# Patient Record
Sex: Female | Born: 1965
Health system: Southern US, Community
[De-identification: ages and names within clinical notes are randomized; demographics above are authoritative.]

## PROBLEM LIST (undated history)

## (undated) DIAGNOSIS — H409 Unspecified glaucoma: Secondary | ICD-10-CM

## (undated) DIAGNOSIS — T7840XA Allergy, unspecified, initial encounter: Secondary | ICD-10-CM

## (undated) DIAGNOSIS — I7121 Aneurysm of the ascending aorta, without rupture: Secondary | ICD-10-CM

## (undated) DIAGNOSIS — I712 Thoracic aortic aneurysm, without rupture: Secondary | ICD-10-CM

## (undated) DIAGNOSIS — H579 Unspecified disorder of eye and adnexa: Secondary | ICD-10-CM

## (undated) HISTORY — PX: SPINE SURGERY: SHX786

## (undated) HISTORY — DX: Unspecified glaucoma: H40.9

## (undated) HISTORY — DX: Unspecified disorder of eye and adnexa: H57.9

## (undated) HISTORY — PX: BACK SURGERY: SHX140

## (undated) HISTORY — DX: Allergy, unspecified, initial encounter: T78.40XA

## (undated) HISTORY — DX: Aneurysm of the ascending aorta, without rupture: I71.21

## (undated) HISTORY — DX: Thoracic aortic aneurysm, without rupture: I71.2

---
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
## 1997-09-14 ENCOUNTER — Other Ambulatory Visit: Admission: RE | Admit: 1997-09-14 | Discharge: 1997-09-14 | Payer: Self-pay | Admitting: *Deleted

## 1999-08-29 ENCOUNTER — Other Ambulatory Visit: Admission: RE | Admit: 1999-08-29 | Discharge: 1999-08-29 | Payer: Self-pay | Admitting: Obstetrics and Gynecology

## 2000-10-15 ENCOUNTER — Other Ambulatory Visit: Admission: RE | Admit: 2000-10-15 | Discharge: 2000-10-15 | Payer: Self-pay | Admitting: Obstetrics and Gynecology

## 2001-11-10 ENCOUNTER — Other Ambulatory Visit: Admission: RE | Admit: 2001-11-10 | Discharge: 2001-11-10 | Payer: Self-pay | Admitting: Obstetrics and Gynecology

## 2003-01-06 ENCOUNTER — Other Ambulatory Visit: Admission: RE | Admit: 2003-01-06 | Discharge: 2003-01-06 | Payer: Self-pay | Admitting: Obstetrics and Gynecology

## 2003-05-15 ENCOUNTER — Ambulatory Visit (HOSPITAL_COMMUNITY): Admission: RE | Admit: 2003-05-15 | Discharge: 2003-05-15 | Payer: Self-pay | Admitting: Hematology & Oncology

## 2004-01-17 ENCOUNTER — Encounter: Admission: RE | Admit: 2004-01-17 | Discharge: 2004-01-17 | Payer: Self-pay | Admitting: Hematology & Oncology

## 2004-02-02 ENCOUNTER — Encounter: Admission: RE | Admit: 2004-02-02 | Discharge: 2004-02-02 | Payer: Self-pay | Admitting: Hematology & Oncology

## 2006-05-05 ENCOUNTER — Ambulatory Visit: Payer: Self-pay | Admitting: Vascular Surgery

## 2006-05-05 ENCOUNTER — Ambulatory Visit: Admission: RE | Admit: 2006-05-05 | Discharge: 2006-05-05 | Payer: Self-pay | Admitting: Hematology & Oncology

## 2006-05-12 ENCOUNTER — Ambulatory Visit (HOSPITAL_COMMUNITY): Admission: RE | Admit: 2006-05-12 | Discharge: 2006-05-12 | Payer: Self-pay | Admitting: Hematology & Oncology

## 2007-07-02 ENCOUNTER — Ambulatory Visit (HOSPITAL_COMMUNITY): Admission: RE | Admit: 2007-07-02 | Discharge: 2007-07-02 | Payer: Self-pay | Admitting: Hematology & Oncology

## 2007-08-31 ENCOUNTER — Inpatient Hospital Stay (HOSPITAL_COMMUNITY): Admission: RE | Admit: 2007-08-31 | Discharge: 2007-09-02 | Payer: Self-pay | Admitting: Neurosurgery

## 2008-08-24 HISTORY — PX: BACK SURGERY: SHX140

## 2010-07-09 NOTE — H&P (Signed)
Hannah Galloway, ZECH                 ACCOUNT NO.:  [id number]   MEDICAL RECORD NO.:  [id number]          PATIENT TYPE:  OIB   LOCATION:  3020                         FACILITY:  [facility]   PHYSICIAN:  Payton Doughty, M.D.      DATE OF BIRTH:  May 14, 1965   DATE OF ADMISSION:  08/31/2007  DATE OF DISCHARGE:                              HISTORY & PHYSICAL   ADMISSION DIAGNOSIS:  Foraminal disk on the right side at L4-L5.   SURGEON:  Payton Doughty, M.D., Neurosurgery.   HISTORY:  This is a very nice 45 year old right-handed white girl  beginning in April pain in her back and down her right hip and down the  right leg.  MR demonstrates foraminal disk on the right side at L4-L5,  and she was tried with epidural steroids to no avail.  She is here now  for foraminal diskectomy on the right at L4-L5.   MEDICAL HISTORY:  She is benign.  She is on oral contraceptives and  Aleve.  She has no allergies, had no operations.   FAMILY HISTORY:  Mother at 4 and father at 25 both were in good health.  Father has diabetes and had a stroke.   REVIEW OF SYSTEMS:  Remarkable for back and leg pain.   PHYSICAL EXAMINATION:  HEENT:  Normal limits.  NECK:  She has good range of motion.  CHEST:  Clear.  CARDIAC:  Regular rate and rhythm.  ABDOMEN:  Nontender.  No hepatosplenomegaly.  EXTREMITIES:  No clubbing or cyanosis.  GU:  Peripheral pulses are good.  NEUROLOGICALLY:  She is awake, alert, and oriented.  Cranial nerves are  intact.  Motor exam shows 5/5 strength throughout the upper and lower  distribution.  Sensory dysesthesia described in the right L4-L5  distribution.  Reflexes are 2 at the knees, 2 at the ankles.  Straight  leg raise is positive on the right, reverse straight leg is not  positive.  No signs of hip irritability.  MR, which read by the  radiologist, mild degenerative changes at L3-L4 and L4-L5.  Closer  inspection reveals a foraminal disk on the right at L4-L5 and  involvement of  the 4  root as it exits.   CLINICAL IMPRESSIONS:  Foraminal disk on the right side at L4-L5.  She  failed conservative therapy.   PLAN:  The plan is for right foraminal diskectomy.  The risks and  benefits have been discussed with her and she wishes to proceed.           ______________________________  Payton Doughty, M.D.    MWR/MEDQ  D:  08/31/2007  T:  09/01/2007  Job:  [id number]

## 2010-07-09 NOTE — Op Note (Signed)
NAMEJEARLDINE, Hannah Galloway                 ACCOUNT NO.:  [id number]   MEDICAL RECORD NO.:  [id number]          PATIENT TYPE:  OIB   LOCATION:  3020                         FACILITY:  [facility]   PHYSICIAN:  Payton Doughty, M.D.      DATE OF BIRTH:  08-02-1965   DATE OF PROCEDURE:  08/31/2007  DATE OF DISCHARGE:                               OPERATIVE REPORT   PREOPERATIVE DIAGNOSIS:  Foraminal disk on the right side at L4-L5.   POSTOPERATIVE DIAGNOSIS:  Foraminal disk on the right side at L4-L5.   PROCEDURE:  Right L4-L5 foraminal diskectomy.   SURGEON:  Payton Doughty, MD   ANESTHESIA:  General endotracheal.   PREPARATION:  Prepped and draped with alcohol wipe.   COMPLICATIONS:  None.   NURSE ASSISTANT:  Covington.   DOCTOR ASSISTANT:  Nudelman.   BODY OF TEXT:  A 45 year old girl with a foraminal disk on the right  side at L4-L5 taken to the operating room, smoothly, anesthetized,  intubated, placed prone on the operating table.  Following shave,  prepped and draped in usual sterile fashion.  Skin was infiltrated with  1% lidocaine with 100,000 epinephrine.  Skin was incised from over the  lamina of L4-L5 on the right side and the lamina 4 was dissected in  subperiosteal plane.  Marker was placed.  Intraoperative x-ray taken to  confirm correctness level operating laterally to medially up the lateral  portion of the pars interarticularis and the superior portion of the  inferior articular process of L4 and the superior portion of the  superior articular process of L5 were removed.  This allowed access to  the foraminal area and the dorsal root ganglion was identified,  retracted superiorly demonstrating the disk just below the diskectomy  was carried out, all graspable fragments were removed.  Following  complete decompression, the nerve root exited freely.  Bony defect was  filled with Depo-Medrol soaked fat.  Successive layers of 0 Vicryl, 2-0  Vicryl, and 4-0 Vicryl were used to close.   Benzoin and Steri-Strips  were placed, made occlusive Telfa and OpSite.  The patient returned to  recovery room in good condition.           ______________________________  Payton Doughty, M.D.     MWR/MEDQ  D:  08/31/2007  T:  09/01/2007  Job:  [id number]

## 2010-11-21 LAB — COMPREHENSIVE METABOLIC PANEL
ALT: 32
AST: 22
Albumin: 4.2
Alkaline Phosphatase: 36 — ABNORMAL LOW
BUN: 9
CO2: 25
Calcium: 9.2
Chloride: 102
Creatinine, Ser: 0.7
GFR calc Af Amer: >60
GFR calc non Af Amer: >60
Glucose, Bld: 93
Potassium: 4
Sodium: 135
Total Bilirubin: 0.7
Total Protein: 6.9

## 2010-11-21 LAB — PROTIME-INR
INR: 1
Prothrombin Time: 13.2

## 2010-11-21 LAB — URINALYSIS, ROUTINE W REFLEX MICROSCOPIC
Bilirubin Urine: NEGATIVE
Glucose, UA: NEGATIVE
Hgb urine dipstick: NEGATIVE
Ketones, ur: NEGATIVE
Nitrite: NEGATIVE
Protein, ur: NEGATIVE
Specific Gravity, Urine: 1.019
Urobilinogen, UA: 1
pH: 7

## 2010-11-21 LAB — URINE MICROSCOPIC-ADD ON

## 2010-11-21 LAB — DIFFERENTIAL
Basophils Absolute: 0
Basophils Relative: 0
Eosinophils Absolute: 0.1
Eosinophils Relative: 1
Lymphocytes Relative: 25
Lymphs Abs: 2.6
Monocytes Absolute: 0.6
Monocytes Relative: 6
Neutro Abs: 7
Neutrophils Relative %: 68

## 2010-11-21 LAB — CBC
HCT: 38.7
Hemoglobin: 13.3
MCHC: 34.4
MCV: 95.9
Platelets: 296
RBC: 4.04
RDW: 14
WBC: 10.3

## 2010-11-21 LAB — APTT: aPTT: 24

## 2012-01-15 LAB — HM MAMMOGRAPHY: HM Mammogram: NEGATIVE

## 2012-04-16 ENCOUNTER — Encounter: Payer: Self-pay | Admitting: Family Medicine

## 2012-04-16 ENCOUNTER — Ambulatory Visit (INDEPENDENT_AMBULATORY_CARE_PROVIDER_SITE_OTHER): Payer: 59 | Admitting: Family Medicine

## 2012-04-16 VITALS — BP 100/68 | HR 72 | Temp 99.0°F | Resp 12 | Ht 66.5 in | Wt 162.0 lb

## 2012-04-16 DIAGNOSIS — H6992 Unspecified Eustachian tube disorder, left ear: Secondary | ICD-10-CM

## 2012-04-16 DIAGNOSIS — H6990 Unspecified Eustachian tube disorder, unspecified ear: Secondary | ICD-10-CM

## 2012-04-16 NOTE — Progress Notes (Signed)
  Subjective:    Patient ID: Hannah Galloway, female    DOB: [birth year], 47 y.o.   MRN: [id number]  HPI New to establish care. Patient sees gynecologist regularly. She apparently had some early glaucoma changes and followed closely by ophthalmology but not started on medication yet. Generally very healthy. She's had previous tonsillectomy. Takes no regular medications. No known drug allergies.  Patient is married and has 2 sons. She works as a Academic librarian at [employer]. No history of smoking. No alcohol use. Last tetanus unknown.  Family history significant for father with history of type 2 diabetes and history of stroke. Father died within the past year of stroke complications. Brother with T-cell lymphoma.  She's had some recent left ear fullness. No vertigo. No hearing changes. No drainage. No nasal congestion. Intermittent diffuse frontal sinus headaches. No aggravating factors. No fever or chills. No sore throat   Review of Systems  Constitutional: Negative for fever and chills.  HENT: Negative for hearing loss, ear pain, congestion, sore throat and ear discharge.   Respiratory: Negative for cough and shortness of breath.   Neurological: Negative for headaches.       Objective:   Physical Exam  Constitutional: She appears well-developed and well-nourished.  HENT:  Right Ear: External ear normal.  Left Ear: External ear normal.  Mouth/Throat: Oropharynx is clear and moist.  Neck: Neck supple. No thyromegaly present.  Cardiovascular: Normal rate and regular rhythm.   Pulmonary/Chest: Effort normal and breath sounds normal. No respiratory distress. She has no wheezes. She has no rales.  Musculoskeletal: She exhibits no edema.  Lymphadenopathy:    She has no cervical adenopathy.          Assessment & Plan:  Probable left eustachian tube dysfunction. Normal exam. Reassurance.  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 Follow closely by ophthalmology.  Health  maintenance. Patient followed by gynecology. We've encouraged her to confirm last tetanus.

## 2012-12-30 ENCOUNTER — Other Ambulatory Visit: Payer: Self-pay

## 2013-03-22 ENCOUNTER — Other Ambulatory Visit (HOSPITAL_COMMUNITY): Payer: Self-pay | Admitting: Neurosurgery

## 2013-03-22 DIAGNOSIS — M5410 Radiculopathy, site unspecified: Secondary | ICD-10-CM

## 2013-03-30 ENCOUNTER — Ambulatory Visit (HOSPITAL_COMMUNITY)
Admission: RE | Admit: 2013-03-30 | Discharge: 2013-03-30 | Disposition: A | Discharge status: Home / Self Care | Payer: 59 | Source: Ambulatory Visit | Attending: Neurosurgery | Admitting: Neurosurgery

## 2013-03-30 DIAGNOSIS — M5410 Radiculopathy, site unspecified: Secondary | ICD-10-CM

## 2013-03-30 DIAGNOSIS — M51379 Other intervertebral disc degeneration, lumbosacral region without mention of lumbar back pain or lower extremity pain: Secondary | ICD-10-CM | POA: Insufficient documentation

## 2013-03-30 DIAGNOSIS — M79609 Pain in unspecified limb: Secondary | ICD-10-CM | POA: Insufficient documentation

## 2013-03-30 DIAGNOSIS — M5144 Schmorl's nodes, thoracic region: Secondary | ICD-10-CM | POA: Insufficient documentation

## 2013-03-30 DIAGNOSIS — M5146 Schmorl's nodes, lumbar region: Secondary | ICD-10-CM | POA: Insufficient documentation

## 2013-03-30 DIAGNOSIS — M5380 Other specified dorsopathies, site unspecified: Secondary | ICD-10-CM | POA: Insufficient documentation

## 2013-03-30 DIAGNOSIS — M5137 Other intervertebral disc degeneration, lumbosacral region: Secondary | ICD-10-CM | POA: Insufficient documentation

## 2013-03-30 DIAGNOSIS — R209 Unspecified disturbances of skin sensation: Secondary | ICD-10-CM | POA: Insufficient documentation

## 2013-03-30 DIAGNOSIS — M25559 Pain in unspecified hip: Secondary | ICD-10-CM | POA: Insufficient documentation

## 2013-03-30 MED ORDER — GADOBENATE DIMEGLUMINE 529 MG/ML IV SOLN
14.0000 mL | Freq: Once | INTRAVENOUS | Status: AC | PRN
Start: 2013-03-30 — End: 2013-03-30
  Administered 2013-03-30: 14 mL via INTRAVENOUS

## 2013-04-01 ENCOUNTER — Ambulatory Visit (HOSPITAL_COMMUNITY): Payer: 59

## 2013-08-29 ENCOUNTER — Telehealth: Payer: Self-pay | Admitting: Family Medicine

## 2013-08-29 NOTE — Telephone Encounter (Signed)
Pt would like to know if you would take her son who is 15 as a new pt?? He is home from college and needs a physical.

## 2013-08-29 NOTE — Telephone Encounter (Signed)
Yes

## 2013-08-31 ENCOUNTER — Telehealth: Payer: Self-pay | Admitting: Family Medicine

## 2013-08-31 ENCOUNTER — Encounter: Payer: Self-pay | Admitting: Family

## 2013-08-31 ENCOUNTER — Ambulatory Visit (INDEPENDENT_AMBULATORY_CARE_PROVIDER_SITE_OTHER): Payer: 59 | Admitting: Family

## 2013-08-31 VITALS — BP 120/80 | HR 73 | Temp 98.5°F | Wt 163.0 lb

## 2013-08-31 DIAGNOSIS — L259 Unspecified contact dermatitis, unspecified cause: Secondary | ICD-10-CM

## 2013-08-31 DIAGNOSIS — L299 Pruritus, unspecified: Secondary | ICD-10-CM

## 2013-08-31 MED ORDER — METHYLPREDNISOLONE 4 MG PO KIT: PACK | ORAL | Status: AC

## 2013-08-31 MED ORDER — METHYLPREDNISOLONE 4 MG PO KIT: PACK | ORAL | Status: DC

## 2013-08-31 NOTE — Telephone Encounter (Signed)
Pt aware Rx was resent

## 2013-08-31 NOTE — Telephone Encounter (Signed)
Pt call to say she is at the [facility] and rx is not there methylPREDNISolone (MEDROL DOSEPAK) 4 MG tablet

## 2013-08-31 NOTE — Patient Instructions (Signed)

## 2013-08-31 NOTE — Progress Notes (Signed)
Pre visit review using our clinic review tool, if applicable. No additional management support is needed unless otherwise documented below in the visit note. 

## 2013-08-31 NOTE — Progress Notes (Signed)
   Subjective:    Patient ID: Hannah Galloway, female    DOB: [birth year], 48 y.o.   MRN: [id number]  HPI  48 year old white female, nonsmoker is in today because a rash to her left arm, neck, anterior chest wall she describes as itchy. Denies any changes in detergents, soaps, lotions. No new foods. Does report being outside picking weeds over the weekend. No known contact with poison ivy or oak. Has been applying calamine lotion with minimal relief.  Review of Systems  Constitutional: Negative.   HENT: Negative.   Respiratory: Negative.  Negative for shortness of breath and wheezing.   Cardiovascular: Negative.   Gastrointestinal: Negative.   Endocrine: Negative.   Musculoskeletal: Negative.   Skin: Positive for rash.       History, red rash to the right arm, neck, and chest  Neurological: Negative.   Psychiatric/Behavioral: Negative.    Past Medical History  Diagnosis Date  . Glaucoma     History   Social History  . Marital Status: Married    Spouse Name: N/A    Number of Children: N/A  . Years of Education: N/A   Occupational History  . Not on file.   Social History Main Topics  . Smoking status: Never Smoker   . Smokeless tobacco: Not on file  . Alcohol Use: Not on file  . Drug Use: Not on file  . Sexual Activity: Not on file   Other Topics Concern  . Not on file   Social History Narrative  . No narrative on file    Past Surgical History  Procedure Laterality Date  . Tonsillectomy and adenoidectomy  1974    Family History  Problem Relation Age of Onset  . Stroke Father   . Heart disease Father   . Diabetes Father     type ll  . Lymphoma Brother 56    t-call  . Cancer Brother 66    T cell lymphoma    No Known Allergies  Current Outpatient Prescriptions on File Prior to Visit  Medication Sig Dispense Refill  . norethindrone-ethinyl estradiol-iron (MICROGESTIN FE,GILDESS FE,LOESTRIN FE) 1.5-30 MG-MCG tablet Take 1 tablet by mouth daily. Per Dr  Garwin Brothers, OB/GYN       No current facility-administered medications on file prior to visit.    BP 120/80  Pulse 73  Temp(Src) 98.5 F (36.9 C) (Oral)  Wt 163 lb (73.936 kg)chart     Objective:   Physical Exam  Constitutional: She is oriented to person, place, and time. She appears well-developed and well-nourished.  Neck: Normal range of motion. Neck supple.  Cardiovascular: Normal rate, regular rhythm and normal heart sounds.   Pulmonary/Chest: Effort normal and breath sounds normal.  Musculoskeletal: Normal range of motion.  Neurological: She is alert and oriented to person, place, and time.  Skin: Rash noted. There is erythema.  Psychiatric: She has a normal mood and affect.          Assessment & Plan:  January was seen today for insect bite.  Diagnoses and associated orders for this visit:  Contact dermatitis  Other Orders - Discontinue: methylPREDNISolone (MEDROL DOSEPAK) 4 MG tablet; follow package directions - methylPREDNISolone (MEDROL DOSEPAK) 4 MG tablet; follow package directions   call the office with any questions or concerns here recheck as scheduled and as needed.

## 2013-09-05 ENCOUNTER — Ambulatory Visit (INDEPENDENT_AMBULATORY_CARE_PROVIDER_SITE_OTHER): Payer: 59 | Admitting: Family Medicine

## 2013-09-05 ENCOUNTER — Encounter: Payer: Self-pay | Admitting: Family Medicine

## 2013-09-05 ENCOUNTER — Telehealth: Payer: Self-pay | Admitting: Family Medicine

## 2013-09-05 VITALS — HR 64 | Temp 98.4°F | Wt 163.0 lb

## 2013-09-05 DIAGNOSIS — L259 Unspecified contact dermatitis, unspecified cause: Secondary | ICD-10-CM

## 2013-09-05 DIAGNOSIS — R21 Rash and other nonspecific skin eruption: Secondary | ICD-10-CM

## 2013-09-05 MED ORDER — METHYLPREDNISOLONE ACETATE 80 MG/ML IJ SUSP
80.0000 mg | Freq: Once | INTRAMUSCULAR | Status: AC
  Administered 2013-09-05: 80 mg via INTRAMUSCULAR

## 2013-09-05 MED ORDER — PREDNISONE 10 MG PO TABS: ORAL_TABLET | ORAL | Status: DC

## 2013-09-05 NOTE — Progress Notes (Signed)
Pre visit review using our clinic review tool, if applicable. No additional management support is needed unless otherwise documented below in the visit note. 

## 2013-09-05 NOTE — Patient Instructions (Signed)

## 2013-09-05 NOTE — Telephone Encounter (Signed)
Patient Information:  Caller Name: [place]  Phone: [phone]  Patient: Hannah Galloway  Gender: Female  DOB: 1965/05/21  Age: 48 Years  PCP: Carolann Littler [facility])  Pregnant: No  Office Follow Up:  Does the office need to follow up with this patient?: No  Instructions For The Office: N/A  RN Note:  This pt. was scheduled to see Dr. Elease Hashimoto today at 15:30.  Symptoms  Reason For Call & Symptoms: Martin Majestic in to see Roxy Cedar for a spider bite on 08/31/13 and was placed on Prednisone. Had a rash. Today(09/05/13) is the last day of the medication. Spider bite was on the right forearm and now the bite is red and raised. No fever. States she cannot tell that the Prednisone helped her at all. Continues to have an itchy rash and the bite has an area of spreading redness the size of a half-dollar.  Reviewed Health History In EMR: Yes  Reviewed Medications In EMR: Yes  Reviewed Allergies In EMR: Yes  Reviewed Surgeries / Procedures: Yes  Date of Onset of Symptoms: 08/28/2013  Treatments Tried: Prednisone  Treatments Tried Worked: No OB / GYN:  LMP: 08/30/2013  Guideline(s) Used:  Insect Bite  Disposition Per Guideline:   See Today in Office  Reason For Disposition Reached:   Red or very tender (to touch) area, getting larger over 48 hours after the bite  Advice Given:  Call Back If:  You become worse.  Patient Will Follow Care Advice:  YES  Appointment Scheduled:  09/05/2013 15:30:00 Appointment Scheduled Provider:  Carolann Littler [facility])

## 2013-09-05 NOTE — Progress Notes (Signed)
   Subjective:    Patient ID: Hannah Galloway, female    DOB: 1965-06-10, 48 y.o.   MRN: [id number]  HPI Patient presents with severe pruritic rash involving right forearm but also several areas on her neck, upper back, and left upper extremity. She was out pulling weeds in her yard a little over week ago. She felt she may have had a bug bite. Her initial patch was right forearm. She went Monday for evaluation here and was prescribed Medrol but this has not helped much. She has itching and warmth. Rash is possibly worse. No fevers or chills.   Review of Systems  Constitutional: Negative for fever and chills.  Skin: Positive for rash.       Objective:   Physical Exam  Constitutional: She appears well-developed and well-nourished.  Cardiovascular: Normal rate and regular rhythm.   Skin: Rash noted.  Patient has slightly raised vesicular rash with erythematous base with large patch right forearm but smaller patches on her right wrist left arm upper chest and anterior and posterior neck          Assessment & Plan:  Contact dermatitis with widespread distribution. Try to avoid getting overheated. Depo-Medrol 80 mg IM given. Oral prednisone taper if not adequately relieved with Depo-Medrol

## 2013-09-05 NOTE — Telephone Encounter (Signed)
Just FYI.

## 2013-09-21 ENCOUNTER — Telehealth: Payer: Self-pay | Admitting: Family Medicine

## 2013-09-21 MED ORDER — PREDNISONE 10 MG PO TABS: ORAL_TABLET | ORAL | Status: DC

## 2013-09-21 NOTE — Telephone Encounter (Signed)
Refill OK

## 2013-09-21 NOTE — Telephone Encounter (Signed)
Dr. Elease Hashimoto has been treating the pt for rash, pt states every time she finishes the the rx predniSONE (DELTASONE) 10 MG tablet the rash has started to come back. States rash has gotten better however not completely gone,pt wants to know if she can get 1 more refill on the prednisone.

## 2013-09-21 NOTE — Telephone Encounter (Signed)
Rx sent to pharmacy   

## 2013-11-12 ENCOUNTER — Emergency Department (HOSPITAL_COMMUNITY)
Admission: EM | Admit: 2013-11-12 | Discharge: 2013-11-12 | Disposition: A | Discharge status: Home / Self Care | Payer: PRIVATE HEALTH INSURANCE | Source: Home / Self Care | Attending: Family Medicine | Admitting: Family Medicine

## 2013-11-12 ENCOUNTER — Encounter (HOSPITAL_COMMUNITY): Payer: Self-pay | Admitting: Emergency Medicine

## 2013-11-12 DIAGNOSIS — IMO0002 Reserved for concepts with insufficient information to code with codable children: Secondary | ICD-10-CM | POA: Diagnosis not present

## 2013-11-12 DIAGNOSIS — S0100XA Unspecified open wound of scalp, initial encounter: Secondary | ICD-10-CM

## 2013-11-12 DIAGNOSIS — S060X0A Concussion without loss of consciousness, initial encounter: Secondary | ICD-10-CM | POA: Diagnosis not present

## 2013-11-12 DIAGNOSIS — S0101XA Laceration without foreign body of scalp, initial encounter: Secondary | ICD-10-CM

## 2013-11-12 MED ORDER — LIDOCAINE-EPINEPHRINE (PF) 2 %-1:200000 IJ SOLN
INTRAMUSCULAR | Status: AC
  Filled 2013-11-12: qty 20

## 2013-11-12 NOTE — ED Notes (Signed)
scalp laceration earlier today from edge of car trunk lid. aprox 2 cm , minimal blood loss

## 2013-11-12 NOTE — ED Provider Notes (Signed)
Hannah Galloway is a 48 y.o. female who presents to Urgent Care today for scalp laceration. Patient was taking her car today when she accidentally hit the top of her head on the open trunk lid. She suffered a laceration to the frontal scalp. Additionally she notes headache dizziness and fogginess. She denies any loss of consciousness or loss of function. No fevers or chills nausea vomiting or diarrhea. She notes that her last tetanus shot was within the last 5 years.   Past Medical History  Diagnosis Date  . Glaucoma    History  Substance Use Topics  . Smoking status: Never Smoker   . Smokeless tobacco: Not on file  . Alcohol Use: No   ROS as above Medications: No current facility-administered medications for this encounter.   Current Outpatient Prescriptions  Medication Sig Dispense Refill  . norethindrone-ethinyl estradiol-iron (MICROGESTIN FE,GILDESS FE,LOESTRIN FE) 1.5-30 MG-MCG tablet Take 1 tablet by mouth daily. Per Dr Garwin Brothers, OB/GYN        Exam:  BP 131/83  Pulse 73  Temp(Src) 98.8 F (37.1 C) (Oral)  SpO2 98% Gen: Well NAD HEENT: EOMI,  MMM PERRLA Lungs: Normal work of breathing. CTABL Heart: RRR no MRG Abd: NABS, Soft. Nondistended, Nontender Exts: Brisk capillary refill, warm and well perfused.  Scalp: 2 cm laceration frontal scalp. Extends through the dermis but not the deep structures. Neuro: Alert and oriented cranial nerves intact normal double. leg balance however impaired tandem and single-leg balance. Normal coordination bilaterally Normal gait 3 recall 3/3 immediate and 2/3 delayed Normal serial sevens  Scalp laceration repair: Area anesthetized with 2 mL of lidocaine with epinephrine. Scalp irrigated with sure cleanse solution. 3 staples used to close the wound.  No results found for this or any previous visit (from the past 24 hour(s)). No results found.  Assessment and Plan: 48 y.o. female with  1) scalp laceration: Tetanus up-to-date. Repaired  with staples. Return in one week for staple removal. 2) concussion: Patient is placed symptoms of concussion. Plan for cognitive and physical rest. Doubt serious intracranial injury given relatively normal neurologic exam  Discussed warning signs or symptoms. Please see discharge instructions. Patient expresses understanding.     Gregor Hams, MD 11/12/13 [phone]

## 2013-11-12 NOTE — Discharge Instructions (Signed)
Thank you for coming in today. Take it easy for the next week or so.  No heavy mental or physical exertion.  Return in 1 week for staple removal.    Concussion A concussion, or closed-head injury, is a brain injury caused by a direct blow to the head or by a quick and sudden movement (jolt) of the head or neck. Concussions are usually not life-threatening. Even so, the effects of a concussion can be serious. If you have had a concussion before, you are more likely to experience concussion-like symptoms after a direct blow to the head.  CAUSES  Direct blow to the head, such as from running into another player during a soccer game, being hit in a fight, or hitting your head on a hard surface.  A jolt of the head or neck that causes the brain to move back and forth inside the skull, such as in a car crash. SIGNS AND SYMPTOMS The signs of a concussion can be hard to notice. Early on, they may be missed by you, family members, and health care providers. You may look fine but act or feel differently. Symptoms are usually temporary, but they may last for days, weeks, or even longer. Some symptoms may appear right away while others may not show up for hours or days. Every head injury is different. Symptoms include:  Mild to moderate headaches that will not go away.  A feeling of pressure inside your head.  Having more trouble than usual:  Learning or remembering things you have heard.  Answering questions.  Paying attention or concentrating.  Organizing daily tasks.  Making decisions and solving problems.  Slowness in thinking, acting or reacting, speaking, or reading.  Getting lost or being easily confused.  Feeling tired all the time or lacking energy (fatigued).  Feeling drowsy.  Sleep disturbances.  Sleeping more than usual.  Sleeping less than usual.  Trouble falling asleep.  Trouble sleeping (insomnia).  Loss of balance or feeling lightheaded or dizzy.  Nausea or  vomiting.  Numbness or tingling.  Increased sensitivity to:  Sounds.  Lights.  Distractions.  Vision problems or eyes that tire easily.  Diminished sense of taste or smell.  Ringing in the ears.  Mood changes such as feeling sad or anxious.  Becoming easily irritated or angry for little or no reason.  Lack of motivation.  Seeing or hearing things other people do not see or hear (hallucinations). DIAGNOSIS Your health care provider can usually diagnose a concussion based on a description of your injury and symptoms. He or she will ask whether you passed out (lost consciousness) and whether you are having trouble remembering events that happened right before and during your injury. Your evaluation might include:  A brain scan to look for signs of injury to the brain. Even if the test shows no injury, you may still have a concussion.  Blood tests to be sure other problems are not present. TREATMENT  Concussions are usually treated in an emergency department, in urgent care, or at a clinic. You may need to stay in the hospital overnight for further treatment.  Tell your health care provider if you are taking any medicines, including prescription medicines, over-the-counter medicines, and natural remedies. Some medicines, such as blood thinners (anticoagulants) and aspirin, may increase the chance of complications. Also tell your health care provider whether you have had alcohol or are taking illegal drugs. This information may affect treatment.  Your health care provider will send you home with  important instructions to follow.  How fast you will recover from a concussion depends on many factors. These factors include how severe your concussion is, what part of your brain was injured, your age, and how healthy you were before the concussion.  Most people with mild injuries recover fully. Recovery can take time. In general, recovery is slower in older persons. Also, persons who  have had a concussion in the past or have other medical problems may find that it takes longer to recover from their current injury. HOME CARE INSTRUCTIONS General Instructions  Carefully follow the directions your health care provider gave you.  Only take over-the-counter or prescription medicines for pain, discomfort, or fever as directed by your health care provider.  Take only those medicines that your health care provider has approved.  Do not drink alcohol until your health care provider says you are well enough to do so. Alcohol and certain other drugs may slow your recovery and can put you at risk of further injury.  If it is harder than usual to remember things, write them down.  If you are easily distracted, try to do one thing at a time. For example, do not try to watch TV while fixing dinner.  Talk with family members or close friends when making important decisions.  Keep all follow-up appointments. Repeated evaluation of your symptoms is recommended for your recovery.  Watch your symptoms and tell others to do the same. Complications sometimes occur after a concussion. Older adults with a brain injury may have a higher risk of serious complications, such as a blood clot on the brain.  Tell your teachers, school nurse, school counselor, coach, athletic trainer, or work Freight forwarder about your injury, symptoms, and restrictions. Tell them about what you can or cannot do. They should watch for:  Increased problems with attention or concentration.  Increased difficulty remembering or learning new information.  Increased time needed to complete tasks or assignments.  Increased irritability or decreased ability to cope with stress.  Increased symptoms.  Rest. Rest helps the brain to heal. Make sure you:  Get plenty of sleep at night. Avoid staying up late at night.  Keep the same bedtime hours on weekends and weekdays.  Rest during the day. Take daytime naps or rest breaks  when you feel tired.  Limit activities that require a lot of thought or concentration. These include:  Doing homework or job-related work.  Watching TV.  Working on the computer.  Avoid any situation where there is potential for another head injury (football, hockey, soccer, basketball, martial arts, downhill snow sports and horseback riding). Your condition will get worse every time you experience a concussion. You should avoid these activities until you are evaluated by the appropriate follow-up health care providers. Returning To Your Regular Activities You will need to return to your normal activities slowly, not all at once. You must give your body and brain enough time for recovery.  Do not return to sports or other athletic activities until your health care provider tells you it is safe to do so.  Ask your health care provider when you can drive, ride a bicycle, or operate heavy machinery. Your ability to react may be slower after a brain injury. Never do these activities if you are dizzy.  Ask your health care provider about when you can return to work or school. Preventing Another Concussion It is very important to avoid another brain injury, especially before you have recovered. In rare cases, another  injury can lead to permanent brain damage, brain swelling, or death. The risk of this is greatest during the first 7-10 days after a head injury. Avoid injuries by:  Wearing a seat belt when riding in a car.  Drinking alcohol only in moderation.  Wearing a helmet when biking, skiing, skateboarding, skating, or doing similar activities.  Avoiding activities that could lead to a second concussion, such as contact or recreational sports, until your health care provider says it is okay.  Taking safety measures in your home.  Remove clutter and tripping hazards from floors and stairways.  Use grab bars in bathrooms and handrails by stairs.  Place non-slip mats on floors and in  bathtubs.  Improve lighting in dim areas. SEEK MEDICAL CARE IF:  You have increased problems paying attention or concentrating.  You have increased difficulty remembering or learning new information.  You need more time to complete tasks or assignments than before.  You have increased irritability or decreased ability to cope with stress.  You have more symptoms than before. Seek medical care if you have any of the following symptoms for more than 2 weeks after your injury:  Lasting (chronic) headaches.  Dizziness or balance problems.  Nausea.  Vision problems.  Increased sensitivity to noise or light.  Depression or mood swings.  Anxiety or irritability.  Memory problems.  Difficulty concentrating or paying attention.  Sleep problems.  Feeling tired all the time. SEEK IMMEDIATE MEDICAL CARE IF:  You have severe or worsening headaches. These may be a sign of a blood clot in the brain.  You have weakness (even if only in one hand, leg, or part of the face).  You have numbness.  You have decreased coordination.  You vomit repeatedly.  You have increased sleepiness.  One pupil is larger than the other.  You have convulsions.  You have slurred speech.  You have increased confusion. This may be a sign of a blood clot in the brain.  You have increased restlessness, agitation, or irritability.  You are unable to recognize people or places.  You have neck pain.  It is difficult to wake you up.  You have unusual behavior changes.  You lose consciousness. MAKE SURE YOU:  Understand these instructions.  Will watch your condition.  Will get help right away if you are not doing well or get worse. Document Released: 05/03/2003 Document Revised: 02/15/2013 Document Reviewed: 09/02/2012 [facility] Patient Information 2015 [place], [place]. This information is not intended to replace advice given to you by your health care provider. Make sure you discuss any  questions you have with your health care provider.   Laceration Care, Adult A laceration is a cut or lesion that goes through all layers of the skin and into the tissue just beneath the skin. TREATMENT  Some lacerations may not require closure. Some lacerations may not be able to be closed due to an increased risk of infection. It is important to see your caregiver as soon as possible after an injury to minimize the risk of infection and maximize the opportunity for successful closure. If closure is appropriate, pain medicines may be given, if needed. The wound will be cleaned to help prevent infection. Your caregiver will use stitches (sutures), staples, wound glue (adhesive), or skin adhesive strips to repair the laceration. These tools bring the skin edges together to allow for faster healing and a better cosmetic outcome. However, all wounds will heal with a scar. Once the wound has healed, scarring can  be minimized by covering the wound with sunscreen during the day for 1 full year. HOME CARE INSTRUCTIONS  For sutures or staples:  Keep the wound clean and dry.  If you were given a bandage (dressing), you should change it at least once a day. Also, change the dressing if it becomes wet or dirty, or as directed by your caregiver.  Wash the wound with soap and water 2 times a day. Rinse the wound off with water to remove all soap. Pat the wound dry with a clean towel.  After cleaning, apply a thin layer of the antibiotic ointment as recommended by your caregiver. This will help prevent infection and keep the dressing from sticking.  You may shower as usual after the first 24 hours. Do not soak the wound in water until the sutures are removed.  Only take over-the-counter or prescription medicines for pain, discomfort, or fever as directed by your caregiver.  Get your sutures or staples removed as directed by your caregiver. For skin adhesive strips:  Keep the wound clean and dry.  Do not  get the skin adhesive strips wet. You may bathe carefully, using caution to keep the wound dry.  If the wound gets wet, pat it dry with a clean towel.  Skin adhesive strips will fall off on their own. You may trim the strips as the wound heals. Do not remove skin adhesive strips that are still stuck to the wound. They will fall off in time. For wound adhesive:  You may briefly wet your wound in the shower or bath. Do not soak or scrub the wound. Do not swim. Avoid periods of heavy perspiration until the skin adhesive has fallen off on its own. After showering or bathing, gently pat the wound dry with a clean towel.  Do not apply liquid medicine, cream medicine, or ointment medicine to your wound while the skin adhesive is in place. This may loosen the film before your wound is healed.  If a dressing is placed over the wound, be careful not to apply tape directly over the skin adhesive. This may cause the adhesive to be pulled off before the wound is healed.  Avoid prolonged exposure to sunlight or tanning lamps while the skin adhesive is in place. Exposure to ultraviolet light in the first year will darken the scar.  The skin adhesive will usually remain in place for 5 to 10 days, then naturally fall off the skin. Do not pick at the adhesive film. You may need a tetanus shot if:  You cannot remember when you had your last tetanus shot.  You have never had a tetanus shot. If you get a tetanus shot, your arm may swell, get red, and feel warm to the touch. This is common and not a problem. If you need a tetanus shot and you choose not to have one, there is a rare chance of getting tetanus. Sickness from tetanus can be serious. SEEK MEDICAL CARE IF:   You have redness, swelling, or increasing pain in the wound.  You see a red line that goes away from the wound.  You have yellowish-white fluid (pus) coming from the wound.  You have a fever.  You notice a bad smell coming from the wound or  dressing.  Your wound breaks open before or after sutures have been removed.  You notice something coming out of the wound such as wood or glass.  Your wound is on your hand or foot and you cannot  move a finger or toe. SEEK IMMEDIATE MEDICAL CARE IF:   Your pain is not controlled with prescribed medicine.  You have severe swelling around the wound causing pain and numbness or a change in color in your arm, hand, leg, or foot.  Your wound splits open and starts bleeding.  You have worsening numbness, weakness, or loss of function of any joint around or beyond the wound.  You develop painful lumps near the wound or on the skin anywhere on your body. MAKE SURE YOU:   Understand these instructions.  Will watch your condition.  Will get help right away if you are not doing well or get worse. Document Released: 02/10/2005 Document Revised: 05/05/2011 Document Reviewed: 08/06/2010 [facility] Patient Information 2015 [place], [place]. This information is not intended to replace advice given to you by your health care provider. Make sure you discuss any questions you have with your health care provider.

## 2013-11-21 ENCOUNTER — Encounter: Payer: Self-pay | Admitting: Family Medicine

## 2013-11-21 ENCOUNTER — Ambulatory Visit (INDEPENDENT_AMBULATORY_CARE_PROVIDER_SITE_OTHER): Payer: 59 | Admitting: Family Medicine

## 2013-11-21 VITALS — BP 118/70 | HR 72 | Wt 165.0 lb

## 2013-11-21 DIAGNOSIS — S0100XA Unspecified open wound of scalp, initial encounter: Secondary | ICD-10-CM

## 2013-11-21 DIAGNOSIS — S0101XD Laceration without foreign body of scalp, subsequent encounter: Secondary | ICD-10-CM

## 2013-11-21 DIAGNOSIS — R519 Headache, unspecified: Secondary | ICD-10-CM

## 2013-11-21 DIAGNOSIS — R51 Headache: Secondary | ICD-10-CM

## 2013-11-21 DIAGNOSIS — Z5189 Encounter for other specified aftercare: Secondary | ICD-10-CM

## 2013-11-21 NOTE — Progress Notes (Signed)
   Subjective:    Patient ID: Hannah Galloway, female    DOB: [birth year], 48 y.o.   MRN: [id number]  Suture / Staple Removal   Patient here for staple removal from scalp. About 9 days ago she accidentally ran into the hood of her trunk. She had no loss of consciousness but she did have some vagal type symptoms. She went to emergency room and had 3 staples placed. No neuro imaging. She has had mild headache during the past week which is intermittent. No confusion. No seizure. No nausea or vomiting. She started back to work today. Her headache tends to be somewhat generalized and usually improved with medication. No focal neurologic symptoms.  Past Medical History  Diagnosis Date  . Glaucoma    Past Surgical History  Procedure Laterality Date  . Tonsillectomy and adenoidectomy  1974    reports that she has never smoked. She does not have any smokeless tobacco history on file. She reports that she does not drink alcohol. Her drug history is not on file. family history includes Cancer (age of onset: 76) in her brother; Diabetes in her father; Heart disease in her father; Lymphoma (age of onset: 33) in her brother; Stroke in her father. No Known Allergies    Review of Systems  Constitutional: Negative for fever and chills.  Neurological: Positive for headaches. Negative for seizures and syncope.  Psychiatric/Behavioral: Negative for confusion.       Objective:   Physical Exam  Constitutional: She is oriented to person, place, and time. She appears well-developed and well-nourished. No distress.  HENT:  Left frontal scalp reveals a laceration which is well-healed. 3 staples were removed without difficulty.  Cardiovascular: Normal rate and regular rhythm.   Pulmonary/Chest: Effort normal and breath sounds normal. No respiratory distress. She has no wheezes. She has no rales.  Musculoskeletal: She exhibits no edema.  Neurological: She is alert and oriented to person, place, and time. No  cranial nerve deficit.          Assessment & Plan:  Laceration left frontal scalp. Healing well. Staples removed. She has had some mild headaches this past week which overall are slightly improved. Followup if these are not resolving over the next few days and sooner for any new symptoms.

## 2013-11-21 NOTE — Progress Notes (Signed)
Pre visit review using our clinic review tool, if applicable. No additional management support is needed unless otherwise documented below in the visit note. 

## 2013-11-28 ENCOUNTER — Ambulatory Visit: Payer: 59 | Admitting: Family Medicine

## 2014-03-13 ENCOUNTER — Telehealth: Payer: Self-pay | Admitting: Family Medicine

## 2014-03-13 MED ORDER — NITROFURANTOIN MONOHYD MACRO 100 MG PO CAPS: 100.0000 mg | ORAL_CAPSULE | Freq: Two times a day (BID) | ORAL | Status: DC

## 2014-03-13 NOTE — Telephone Encounter (Signed)
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 It showed moderate blood,small lucasites, bacteria. white cells and red.cells.  Pt is going to fax the report, but would like to know if you will rx something to [place].

## 2014-03-13 NOTE — Telephone Encounter (Signed)
If no hx of intolerance, lets start Macrobid one po bid for 5 days.

## 2014-03-13 NOTE — Telephone Encounter (Signed)
Pt is aware that RX sent to pharmacy. Pt stated no intolerance to the medication.

## 2014-03-13 NOTE — Telephone Encounter (Signed)
She is having dysuria, itching, and frequency.

## 2014-03-15 ENCOUNTER — Telehealth: Payer: Self-pay | Admitting: Family Medicine

## 2014-03-15 MED ORDER — FLUCONAZOLE 150 MG PO TABS: 150.0000 mg | ORAL_TABLET | Freq: Once | ORAL | Status: DC

## 2014-03-15 NOTE — Telephone Encounter (Signed)
Pt uti is better pt is just having vaginal itching now. [facility] out[facility] pharm

## 2014-03-15 NOTE — Telephone Encounter (Signed)
Rx sent to pharmacy and Pt is aware 

## 2014-03-15 NOTE — Telephone Encounter (Signed)
Fluconazole 150mg #1 dose

## 2015-03-12 DIAGNOSIS — M546 Pain in thoracic spine: Secondary | ICD-10-CM | POA: Diagnosis not present

## 2015-03-12 DIAGNOSIS — M542 Cervicalgia: Secondary | ICD-10-CM | POA: Diagnosis not present

## 2015-03-12 DIAGNOSIS — M545 Low back pain: Secondary | ICD-10-CM | POA: Diagnosis not present

## 2015-04-30 MED FILL — NORETHIN-ESTRAD-FERR 1-0.02: 1-20 | 84 days supply | Qty: 84 | Fill #2

## 2015-05-18 DIAGNOSIS — S332XXA Dislocation of sacroiliac and sacrococcygeal joint, initial encounter: Secondary | ICD-10-CM | POA: Diagnosis not present

## 2015-05-18 DIAGNOSIS — M9904 Segmental and somatic dysfunction of sacral region: Secondary | ICD-10-CM | POA: Diagnosis not present

## 2015-06-08 MED FILL — DESONATE 0.05% GEL: 0.05 | 20 days supply | Qty: 60 | Fill #1

## 2015-06-08 MED FILL — ELIDEL 1% CREAM: 1 | 30 days supply | Qty: 100 | Fill #1

## 2015-06-22 DIAGNOSIS — M9904 Segmental and somatic dysfunction of sacral region: Secondary | ICD-10-CM | POA: Diagnosis not present

## 2015-06-22 DIAGNOSIS — S332XXA Dislocation of sacroiliac and sacrococcygeal joint, initial encounter: Secondary | ICD-10-CM | POA: Diagnosis not present

## 2015-07-23 MED FILL — NORETHIN-ESTRAD-FERR 1-0.02: 1-20 | 84 days supply | Qty: 84 | Fill #3

## 2015-10-12 DIAGNOSIS — M9904 Segmental and somatic dysfunction of sacral region: Secondary | ICD-10-CM | POA: Diagnosis not present

## 2015-10-12 DIAGNOSIS — S332XXA Dislocation of sacroiliac and sacrococcygeal joint, initial encounter: Secondary | ICD-10-CM | POA: Diagnosis not present

## 2015-10-16 MED FILL — NORETHIN-ESTRAD-FERR 1-0.02: 1-20 | 56 days supply | Qty: 56 | Fill #0

## 2015-11-02 DIAGNOSIS — H40023 Open angle with borderline findings, high risk, bilateral: Secondary | ICD-10-CM | POA: Diagnosis not present

## 2015-11-12 DIAGNOSIS — M9903 Segmental and somatic dysfunction of lumbar region: Secondary | ICD-10-CM | POA: Diagnosis not present

## 2015-11-12 DIAGNOSIS — M5431 Sciatica, right side: Secondary | ICD-10-CM | POA: Diagnosis not present

## 2015-11-15 DIAGNOSIS — M5431 Sciatica, right side: Secondary | ICD-10-CM | POA: Diagnosis not present

## 2015-11-15 DIAGNOSIS — M9903 Segmental and somatic dysfunction of lumbar region: Secondary | ICD-10-CM | POA: Diagnosis not present

## 2015-11-19 DIAGNOSIS — M9903 Segmental and somatic dysfunction of lumbar region: Secondary | ICD-10-CM | POA: Diagnosis not present

## 2015-11-19 DIAGNOSIS — M5431 Sciatica, right side: Secondary | ICD-10-CM | POA: Diagnosis not present

## 2015-11-20 DIAGNOSIS — M9903 Segmental and somatic dysfunction of lumbar region: Secondary | ICD-10-CM | POA: Diagnosis not present

## 2015-11-20 DIAGNOSIS — M5431 Sciatica, right side: Secondary | ICD-10-CM | POA: Diagnosis not present

## 2015-11-22 DIAGNOSIS — M5431 Sciatica, right side: Secondary | ICD-10-CM | POA: Diagnosis not present

## 2015-11-22 DIAGNOSIS — M9903 Segmental and somatic dysfunction of lumbar region: Secondary | ICD-10-CM | POA: Diagnosis not present

## 2015-11-26 DIAGNOSIS — M5431 Sciatica, right side: Secondary | ICD-10-CM | POA: Diagnosis not present

## 2015-11-26 DIAGNOSIS — M9903 Segmental and somatic dysfunction of lumbar region: Secondary | ICD-10-CM | POA: Diagnosis not present

## 2015-11-28 MED FILL — NORETHIN-ESTRAD-FERR 1-0.02: 1-20 | 28 days supply | Qty: 28 | Fill #0

## 2015-12-03 DIAGNOSIS — M5431 Sciatica, right side: Secondary | ICD-10-CM | POA: Diagnosis not present

## 2015-12-03 DIAGNOSIS — M9903 Segmental and somatic dysfunction of lumbar region: Secondary | ICD-10-CM | POA: Diagnosis not present

## 2015-12-05 DIAGNOSIS — M9903 Segmental and somatic dysfunction of lumbar region: Secondary | ICD-10-CM | POA: Diagnosis not present

## 2015-12-05 DIAGNOSIS — M5431 Sciatica, right side: Secondary | ICD-10-CM | POA: Diagnosis not present

## 2015-12-17 DIAGNOSIS — M5431 Sciatica, right side: Secondary | ICD-10-CM | POA: Diagnosis not present

## 2015-12-17 DIAGNOSIS — M9903 Segmental and somatic dysfunction of lumbar region: Secondary | ICD-10-CM | POA: Diagnosis not present

## 2015-12-19 DIAGNOSIS — M5431 Sciatica, right side: Secondary | ICD-10-CM | POA: Diagnosis not present

## 2015-12-19 DIAGNOSIS — M9903 Segmental and somatic dysfunction of lumbar region: Secondary | ICD-10-CM | POA: Diagnosis not present

## 2015-12-26 DIAGNOSIS — Z6825 Body mass index (BMI) 25.0-25.9, adult: Secondary | ICD-10-CM | POA: Diagnosis not present

## 2015-12-26 DIAGNOSIS — Z1231 Encounter for screening mammogram for malignant neoplasm of breast: Secondary | ICD-10-CM | POA: Diagnosis not present

## 2015-12-26 DIAGNOSIS — Z01419 Encounter for gynecological examination (general) (routine) without abnormal findings: Secondary | ICD-10-CM | POA: Diagnosis not present

## 2015-12-26 DIAGNOSIS — Z1151 Encounter for screening for human papillomavirus (HPV): Secondary | ICD-10-CM | POA: Diagnosis not present

## 2015-12-26 MED FILL — NORETHIN-ESTRAD-FERR 1-0.02: 1-20 | 84 days supply | Qty: 84 | Fill #0

## 2015-12-27 DIAGNOSIS — M5431 Sciatica, right side: Secondary | ICD-10-CM | POA: Diagnosis not present

## 2015-12-27 DIAGNOSIS — M9903 Segmental and somatic dysfunction of lumbar region: Secondary | ICD-10-CM | POA: Diagnosis not present

## 2016-01-02 DIAGNOSIS — M5431 Sciatica, right side: Secondary | ICD-10-CM | POA: Diagnosis not present

## 2016-01-02 DIAGNOSIS — M9903 Segmental and somatic dysfunction of lumbar region: Secondary | ICD-10-CM | POA: Diagnosis not present

## 2016-01-04 ENCOUNTER — Encounter: Payer: Self-pay | Admitting: Podiatry

## 2016-01-04 ENCOUNTER — Other Ambulatory Visit: Payer: Self-pay

## 2016-01-04 ENCOUNTER — Ambulatory Visit (INDEPENDENT_AMBULATORY_CARE_PROVIDER_SITE_OTHER): Payer: 59

## 2016-01-04 ENCOUNTER — Ambulatory Visit (INDEPENDENT_AMBULATORY_CARE_PROVIDER_SITE_OTHER): Payer: 59 | Admitting: Podiatry

## 2016-01-04 VITALS — BP 128/82 | HR 77 | Resp 14 | Ht 67.0 in | Wt 155.0 lb

## 2016-01-04 DIAGNOSIS — R52 Pain, unspecified: Secondary | ICD-10-CM

## 2016-01-04 DIAGNOSIS — M779 Enthesopathy, unspecified: Secondary | ICD-10-CM | POA: Diagnosis not present

## 2016-01-04 DIAGNOSIS — M722 Plantar fascial fibromatosis: Secondary | ICD-10-CM | POA: Diagnosis not present

## 2016-01-04 DIAGNOSIS — M79671 Pain in right foot: Secondary | ICD-10-CM | POA: Diagnosis not present

## 2016-01-04 MED ORDER — MELOXICAM 15 MG PO TABS: 15.0000 mg | ORAL_TABLET | Freq: Every day | ORAL | 2 refills | Status: AC

## 2016-01-04 MED ORDER — MELOXICAM 15 MG PO TABS: 15.0000 mg | ORAL_TABLET | Freq: Every day | ORAL | 2 refills | Status: DC

## 2016-01-04 MED FILL — MELOXICAM 15 MG TABLET: 15 | 30 days supply | Qty: 30 | Fill #0

## 2016-01-04 NOTE — Progress Notes (Signed)
   Subjective:    Patient ID: Hannah Galloway, female    DOB: 1965/07/16, 50 y.o.   MRN: [id number]  HPI 50 year old female persists the office with concerns of right foot pain which is been ongoing for about 6 months. The majority pain is in the arch of the foot as well as the site of the foot. She denies any recent injury or trauma. No swelling or redness. No numbness or tingling. The pain does not wake her up at night. She's had no recent treatment for this. No other complaints.  Review of Systems  All other systems reviewed and are negative.      Objective:   Physical Exam General: AAO x3, NAD  Dermatological: Skin is warm, dry and supple bilateral. Nails x 10 are well manicured; remaining integument appears unremarkable at this time. There are no open sores, no preulcerative lesions, no rash or signs of infection present.  Vascular: Dorsalis Pedis artery and Posterior Tibial artery pedal pulses are 2/4 bilateral with immedate capillary fill time.  There is no pain with calf compression, swelling, warmth, erythema.   Neruologic: Grossly intact via light touch bilateral. Vibratory intact via tuning fork bilateral. Protective threshold with Semmes Wienstein monofilament intact to all pedal sites bilateral.   Musculoskeletal: Rectus foot type is present. There is tenderness on the course of the plantar fascial medial band within the arch of the foot. The plantar fascia appears to be intact. There is no pain with lateral compression of the calcaneus. There is mild discomfort on the course of the peroneal tendons on the lateral aspect of the foot. The tendons appear to be intact. There is no overlying edema, erythema, increase in warmth. MMT 5/5. Range of motion intact.  Gait: Unassisted, Nonantalgic.      Assessment & Plan:  50 year old female with plantar fasciitis/tendinitis -Treatment options discussed including all alternatives, risks, and complications -Etiology of symptoms were  discussed -X-rays were obtained and reviewed with the patient. Small inferior calcaneal spurs present. No evidence of acute fracture. -Powersteos dispensed. -Stretching, icing exercises daily -Prescribed mobic. Discussed side effects of the medication and directed to stop if any are to occur and call the office.  -Follow-up in 4 weeks or sooner if any problems arise. In the meantime, encouraged to call the office with any questions, concerns, change in symptoms.   Celesta Gentile, DPM

## 2016-01-15 NOTE — Patient Instructions (Signed)

## 2016-01-28 DIAGNOSIS — H40023 Open angle with borderline findings, high risk, bilateral: Secondary | ICD-10-CM | POA: Diagnosis not present

## 2016-01-28 DIAGNOSIS — H52223 Regular astigmatism, bilateral: Secondary | ICD-10-CM | POA: Diagnosis not present

## 2016-02-04 ENCOUNTER — Encounter: Payer: Self-pay | Admitting: Podiatry

## 2016-02-04 ENCOUNTER — Ambulatory Visit (INDEPENDENT_AMBULATORY_CARE_PROVIDER_SITE_OTHER): Payer: 59 | Admitting: Podiatry

## 2016-02-04 DIAGNOSIS — M722 Plantar fascial fibromatosis: Secondary | ICD-10-CM | POA: Diagnosis not present

## 2016-02-04 DIAGNOSIS — M779 Enthesopathy, unspecified: Secondary | ICD-10-CM

## 2016-02-04 MED ORDER — METHYLPREDNISOLONE 4 MG PO TBPK: ORAL_TABLET | ORAL | 0 refills | Status: DC

## 2016-02-04 MED FILL — METHYLPREDNISOLONE 4 MG TAB: 4 | 6 days supply | Qty: 21 | Fill #0

## 2016-02-05 MED FILL — MELOXICAM 15 MG TABLET: 15 | 30 days supply | Qty: 30 | Fill #1

## 2016-02-14 NOTE — Progress Notes (Signed)
Subjective: Hannah Galloway presents to the office today for follow-up evaluation of right heel pain. They state that they are doing somewhat better but she is still getting pain throughout the day. She has been working as stretching, icing exercises. She also has been using inserts. Negative meloxicam as needed. Denies any recent injury or trauma. No numbness or tingling. She describes as a burning sensation in the bottom of her heel. They have been icing, stretching, try to wear supportive shoe as much as possible. No other complaints at this time. No acute changes since last appointment. They deny any systemic complaints such as fevers, chills, nausea, vomiting.  Objective: General: AAO x3, NAD  Dermatological: Skin is warm, dry and supple bilateral. Nails x 10 are well manicured; remaining integument appears unremarkable at this time. There are no open sores, no preulcerative lesions, no rash or signs of infection present.  Vascular: Dorsalis Pedis artery and Posterior Tibial artery pedal pulses are 2/4 bilateral with immedate capillary fill time. Pedal hair growth present. There is no pain with calf compression, swelling, warmth, erythema.   Neruologic: Grossly intact via light touch bilateral. Vibratory intact via tuning fork bilateral. Protective threshold with Semmes Wienstein monofilament intact to all pedal sites bilateral.   Musculoskeletal: There is mild but some improved tenderness palpation along the plantar medial tubercle of the calcaneus at the insertion of the plantar fascia on the right foot. There is mild pain along the course of the plantar fascia within the arch of the foot and this appears to be where the majority of pain is. Plantar fascia appears to be intact bilaterally. There is no pain with lateral compression of the calcaneus and there is no pain with vibratory sensation. There is no pain along the course or insertion of the Achilles tendon. Mild tenderness along the course of  the peroneal tendon continues. There are no other areas of tenderness to bilateral lower extremities. No gross boney pedal deformities bilateral. No pain, crepitus, or limitation noted with foot and ankle range of motion bilateral. Muscular strength 5/5 in all groups tested bilateral.  Gait: Unassisted, Nonantalgic.   Assessment: Presents for follow-up evaluation for heel pain, likely plantar fasciitis; peroneal compensation  Plan: -Treatment options discussed including all alternatives, risks, and complications -Discussed steroid injection today. -Prescribed medrol dose pack. Discussed side effects of the medication and directed to stop if any are to occur and call the office.  -Discussed custom orthotics/physical therapy -Ice and stretching exercises on a daily basis. -Continue supportive shoe gear. -Follow-up in 4 weeks or sooner if any problems arise. In the meantime, encouraged to call the office with any questions, concerns, change in symptoms.   Celesta Gentile, DPM

## 2016-02-20 DIAGNOSIS — M5431 Sciatica, right side: Secondary | ICD-10-CM | POA: Diagnosis not present

## 2016-02-20 DIAGNOSIS — M9903 Segmental and somatic dysfunction of lumbar region: Secondary | ICD-10-CM | POA: Diagnosis not present

## 2016-03-03 ENCOUNTER — Ambulatory Visit: Payer: 59 | Admitting: Podiatry

## 2016-03-31 MED FILL — NORETHIN-ESTRAD-FERR 1-0.02: 1-20 | 84 days supply | Qty: 84 | Fill #1

## 2016-04-11 MED FILL — NYSTATIN-TRIAMCINOLONE OINT: 100000-0.1 | 30 days supply | Qty: 45 | Fill #0

## 2016-04-11 MED FILL — FLUCONAZOLE 150 MG TABLET: 150 | 7 days supply | Qty: 3 | Fill #0

## 2016-05-28 DIAGNOSIS — M9903 Segmental and somatic dysfunction of lumbar region: Secondary | ICD-10-CM | POA: Diagnosis not present

## 2016-05-28 DIAGNOSIS — M5431 Sciatica, right side: Secondary | ICD-10-CM | POA: Diagnosis not present

## 2016-06-16 MED FILL — LARIN FE 1-20 TABLET: 1-20 | 84 days supply | Qty: 84 | Fill #2

## 2016-07-23 ENCOUNTER — Telehealth: Payer: 59 | Admitting: Family

## 2016-07-23 DIAGNOSIS — L255 Unspecified contact dermatitis due to plants, except food: Secondary | ICD-10-CM | POA: Diagnosis not present

## 2016-07-23 MED ORDER — PREDNISONE 10 MG (21) PO TBPK: ORAL_TABLET | ORAL | 0 refills | Status: DC

## 2016-07-23 MED FILL — predniSONE 10 MG (21) TBPK: 10 | 6 days supply | Qty: 21 | Fill #0

## 2016-07-23 NOTE — Progress Notes (Signed)

## 2016-09-07 MED FILL — LARIN FE 1-20 TABLET: 1-20 | 84 days supply | Qty: 84 | Fill #3

## 2016-09-10 DIAGNOSIS — M9903 Segmental and somatic dysfunction of lumbar region: Secondary | ICD-10-CM | POA: Diagnosis not present

## 2016-09-10 DIAGNOSIS — M5431 Sciatica, right side: Secondary | ICD-10-CM | POA: Diagnosis not present

## 2016-10-08 DIAGNOSIS — M9903 Segmental and somatic dysfunction of lumbar region: Secondary | ICD-10-CM | POA: Diagnosis not present

## 2016-10-08 DIAGNOSIS — M5431 Sciatica, right side: Secondary | ICD-10-CM | POA: Diagnosis not present

## 2016-11-13 ENCOUNTER — Encounter: Payer: Self-pay | Admitting: Family Medicine

## 2016-12-03 MED FILL — LARIN FE 1-20 TABLET: 1-20 | 84 days supply | Qty: 84 | Fill #4

## 2016-12-18 MED FILL — MELOXICAM 15 MG TABLET: 15 | 30 days supply | Qty: 30 | Fill #2

## 2017-01-24 DIAGNOSIS — S332XXA Dislocation of sacroiliac and sacrococcygeal joint, initial encounter: Secondary | ICD-10-CM | POA: Diagnosis not present

## 2017-01-24 DIAGNOSIS — M9904 Segmental and somatic dysfunction of sacral region: Secondary | ICD-10-CM | POA: Diagnosis not present

## 2017-01-26 DIAGNOSIS — Z6824 Body mass index (BMI) 24.0-24.9, adult: Secondary | ICD-10-CM | POA: Diagnosis not present

## 2017-01-26 DIAGNOSIS — Z01419 Encounter for gynecological examination (general) (routine) without abnormal findings: Secondary | ICD-10-CM | POA: Diagnosis not present

## 2017-01-26 DIAGNOSIS — Z1151 Encounter for screening for human papillomavirus (HPV): Secondary | ICD-10-CM | POA: Diagnosis not present

## 2017-01-26 DIAGNOSIS — Z1231 Encounter for screening mammogram for malignant neoplasm of breast: Secondary | ICD-10-CM | POA: Diagnosis not present

## 2017-01-26 LAB — HM MAMMOGRAPHY

## 2017-02-09 DIAGNOSIS — H40003 Preglaucoma, unspecified, bilateral: Secondary | ICD-10-CM | POA: Diagnosis not present

## 2017-02-09 DIAGNOSIS — H5213 Myopia, bilateral: Secondary | ICD-10-CM | POA: Diagnosis not present

## 2017-03-05 MED FILL — LARIN FE 1-20 TABLET: 1-20 | 84 days supply | Qty: 84 | Fill #0

## 2017-03-09 ENCOUNTER — Encounter: Payer: Self-pay | Admitting: Family Medicine

## 2017-05-22 DIAGNOSIS — H40023 Open angle with borderline findings, high risk, bilateral: Secondary | ICD-10-CM | POA: Diagnosis not present

## 2017-05-25 MED FILL — LARIN FE 1-20 TABLET: 1-20 | 84 days supply | Qty: 84 | Fill #1

## 2017-08-17 MED FILL — NORETHIN-ESTRAD-FERR 1-0.02: 1-20 | 84 days supply | Qty: 84 | Fill #2

## 2017-11-09 MED FILL — NORETHIN-ESTRAD-FERR 1-0.02: 1-20 | 84 days supply | Qty: 84 | Fill #3

## 2017-12-29 ENCOUNTER — Ambulatory Visit: Payer: Self-pay | Admitting: *Deleted

## 2017-12-29 ENCOUNTER — Encounter (HOSPITAL_COMMUNITY): Payer: Self-pay | Admitting: Emergency Medicine

## 2017-12-29 ENCOUNTER — Emergency Department (HOSPITAL_COMMUNITY): Payer: 59

## 2017-12-29 ENCOUNTER — Emergency Department (HOSPITAL_COMMUNITY)
Admission: EM | Admit: 2017-12-29 | Discharge: 2017-12-29 | Disposition: A | Discharge status: Home / Self Care | Payer: 59 | Attending: Emergency Medicine | Admitting: Emergency Medicine

## 2017-12-29 DIAGNOSIS — I77819 Aortic ectasia, unspecified site: Secondary | ICD-10-CM | POA: Diagnosis not present

## 2017-12-29 DIAGNOSIS — I7789 Other specified disorders of arteries and arterioles: Secondary | ICD-10-CM

## 2017-12-29 DIAGNOSIS — R0789 Other chest pain: Secondary | ICD-10-CM | POA: Diagnosis not present

## 2017-12-29 DIAGNOSIS — Z79899 Other long term (current) drug therapy: Secondary | ICD-10-CM | POA: Insufficient documentation

## 2017-12-29 DIAGNOSIS — R0781 Pleurodynia: Secondary | ICD-10-CM | POA: Insufficient documentation

## 2017-12-29 DIAGNOSIS — R079 Chest pain, unspecified: Secondary | ICD-10-CM | POA: Diagnosis not present

## 2017-12-29 LAB — BASIC METABOLIC PANEL
Anion gap: 9 (ref 5–15)
BUN: 11 mg/dL (ref 6–20)
CALCIUM: 8.5 mg/dL — AB (ref 8.9–10.3)
CO2: 23 mmol/L (ref 22–32)
Chloride: 106 mmol/L (ref 98–111)
Creatinine, Ser: 0.85 mg/dL (ref 0.44–1.00)
GFR calc Af Amer: >60 mL/min (ref >60)
GFR calc non Af Amer: >60 mL/min (ref >60)
Glucose, Bld: 95 mg/dL (ref 70–99)
Potassium: 3.8 mmol/L (ref 3.5–5.1)
Sodium: 138 mmol/L (ref 135–145)

## 2017-12-29 LAB — CBC
HEMATOCRIT: 39 % (ref 36.0–46.0)
Hemoglobin: 12.3 g/dL (ref 12.0–15.0)
MCH: 31.9 pg (ref 26.0–34.0)
MCHC: 31.5 g/dL (ref 30.0–36.0)
MCV: 101.3 fL — ABNORMAL HIGH (ref 80.0–100.0)
Platelets: 262 10*3/uL (ref 150–400)
RBC: 3.85 MIL/uL — AB (ref 3.87–5.11)
RDW: 13.4 % (ref 11.5–15.5)
WBC: 6.1 10*3/uL (ref 4.0–10.5)
nRBC: 0 % (ref 0.0–0.2)

## 2017-12-29 LAB — I-STAT BETA HCG BLOOD, ED (MC, WL, AP ONLY)

## 2017-12-29 LAB — I-STAT TROPONIN, ED: Troponin i, poc: 0 ng/mL (ref 0.00–0.08)

## 2017-12-29 LAB — D-DIMER, QUANTITATIVE (NOT AT ARMC): D DIMER QUANT: 0.86 ug{FEU}/mL — AB (ref 0.00–0.50)

## 2017-12-29 MED ORDER — IOPAMIDOL (ISOVUE-370) INJECTION 76%
INTRAVENOUS | Status: AC
  Filled 2017-12-29: qty 100

## 2017-12-29 MED ORDER — IOPAMIDOL (ISOVUE-370) INJECTION 76%
100.0000 mL | Freq: Once | INTRAVENOUS | Status: AC | PRN
  Administered 2017-12-29: 100 mL via INTRAVENOUS

## 2017-12-29 MED ORDER — KETOROLAC TROMETHAMINE 30 MG/ML IJ SOLN
30.0000 mg | Freq: Once | INTRAMUSCULAR | Status: AC
  Administered 2017-12-29: 30 mg via INTRAVENOUS
  Filled 2017-12-29: qty 1

## 2017-12-29 MED ORDER — SODIUM CHLORIDE 0.9 % IJ SOLN
INTRAMUSCULAR | Status: AC
  Filled 2017-12-29: qty 50

## 2017-12-29 NOTE — ED Triage Notes (Signed)
Pt reports last night she had sharp left chest pains for almost an hour. Husband took her to [facility] Ed but stopped when got there so wasn't seen. Pt repots today having chest heaviness on left side.

## 2017-12-29 NOTE — Telephone Encounter (Signed)
FYI °Patient has arrived at the ED. °

## 2017-12-29 NOTE — ED Provider Notes (Signed)
[facility] DEPT Provider Note   CSN: [id number] Arrival date & time: 12/29/17  7169     History   Chief Complaint Chief Complaint  Patient presents with  . Chest Pain    HPI Hannah Galloway is a 52 y.o. female.  HPI Patient is a 52 year old female presents the emergency department with acute onset left-sided pleuritic chest pain which began last night.  It was severe.  She drove herself to the emergency department.  On arrival to the emergency department before checking and her pain had resolved and that she went home.  She presented to work today and began having pleuritic left-sided chest pain as well.  Is been persistent for the past hour.  She feels somewhat heavy on the left side of her chest as well.  Mild shortness of breath.  No recent travel or surgery.  No history of DVT or pulmonary embolism.  There is a family history of cardiac disease.  She is healthy and exercises daily.  She has no exertional chest pain while exercising.  No recent illness.  No fevers or chills.  Symptoms are mild to moderate in severity but are new for the patient.   Past Medical History:  Diagnosis Date  . Glaucoma     There are no active problems to display for this patient.   Past Surgical History:  Procedure Laterality Date  . TONSILLECTOMY AND ADENOIDECTOMY  1974     OB History   None      Home Medications    Prior to Admission medications   Medication Sig Start Date End Date Taking? Authorizing Provider  Benzyl Alcohol-Camphor-Menthol (IVY-DRY SUPER CONTINUOUS SPRAY) 10-0.5-0.25 % AERO Apply 1 spray topically every 2 (two) hours as needed (itching).   Yes [provider]  diphenhydrAMINE (BENADRYL) 25 mg capsule Take 25 mg by mouth every 6 (six) hours as needed for itching.   Yes [provider]  ibuprofen (ADVIL,MOTRIN) 200 MG tablet Take 400 mg by mouth every 6 (six) hours as needed for mild pain.   Yes [provider]    norethindrone-ethinyl estradiol (JUNEL FE,GILDESS FE,LOESTRIN FE) 1-20 MG-MCG tablet Take 1 tablet by mouth daily. 11/08/17  Yes [provider]  fluconazole (DIFLUCAN) 150 MG tablet Take 1 tablet (150 mg total) by mouth once. Patient not taking: Reported on 01/04/2016 03/15/14   Eulas Post, MD  methylPREDNISolone (MEDROL DOSEPAK) 4 MG TBPK tablet Take as directed Patient not taking: Reported on 12/29/2017 02/04/16   [name], DPM  nitrofurantoin, macrocrystal-monohydrate, (MACROBID) 100 MG capsule Take 1 capsule (100 mg total) by mouth 2 (two) times daily. Patient not taking: Reported on 01/04/2016 03/13/14   Eulas Post, MD  predniSONE (STERAPRED UNI-PAK 21 TAB) 10 MG (21) TBPK tablet Use as directed Patient not taking: Reported on 12/29/2017 07/23/16   Sharion Balloon, FNP    Family History Family History  Problem Relation Age of Onset  . Stroke Father   . Heart disease Father   . Diabetes Father        type ll  . Lymphoma Brother 67       t-call  . Cancer Brother 33       T cell lymphoma    Social History Social History   Tobacco Use  . Smoking status: Never Smoker  . Smokeless tobacco: Never Used  Substance Use Topics  . Alcohol use: No  . Drug use: Not on file  Allergies   Patient has no known allergies.   Review of Systems Review of Systems  All other systems reviewed and are negative.    Physical Exam Updated Vital Signs BP 110/72   Pulse 70   Temp 98 F (36.7 C) (Oral)   Resp 14   Ht 5\' 6"  (1.676 m)   Wt 63.5 kg   LMP 12/08/2017   SpO2 100%   BMI 22.60 kg/m   Physical Exam  Constitutional: She is oriented to person, place, and time. She appears well-developed and well-nourished. No distress.  HENT:  Head: Normocephalic and atraumatic.  Eyes: EOM are normal.  Neck: Normal range of motion.  Cardiovascular: Normal rate, regular rhythm and normal heart sounds.  Pulmonary/Chest: Effort normal and breath sounds  normal.  Abdominal: Soft. She exhibits no distension. There is no tenderness.  Musculoskeletal: Normal range of motion.  Neurological: She is alert and oriented to person, place, and time.  Skin: Skin is warm and dry.  Psychiatric: She has a normal mood and affect. Judgment normal.  Nursing note and vitals reviewed.    ED Treatments / Results  Labs (all labs ordered are listed, but only abnormal results are displayed) Labs Reviewed  BASIC METABOLIC PANEL - Abnormal; Notable for the following components:      Result Value   Calcium 8.5 (*)    All other components within normal limits  CBC - Abnormal; Notable for the following components:   RBC 3.85 (*)    MCV 101.3 (*)    All other components within normal limits  D-DIMER, QUANTITATIVE (NOT AT [facility]) - Abnormal; Notable for the following components:   D-Dimer, Quant 0.86 (*)    All other components within normal limits  I-STAT TROPONIN, ED  I-STAT BETA HCG BLOOD, ED ([facility], [facility], [facility] ONLY)    EKG EKG Interpretation  Date/Time:  Tuesday December 29 2017 09:45:51 EST Ventricular Rate:  69 PR Interval:    QRS Duration: 120 QT Interval:  396 QTC Calculation: 425 R Axis:   96 Text Interpretation:  Sinus rhythm Consider right atrial enlargement Nonspecific intraventricular conduction delay Baseline wander in lead(s) I aVR No significant change was found Confirmed by Jola Schmidt [phone]) on 12/29/2017 10:16:47 AM   Radiology Dg Chest 2 View  Result Date: 12/29/2017 CLINICAL DATA:  Chest pain. EXAM: CHEST - 2 VIEW COMPARISON:  08/25/2007 FINDINGS: The heart size and mediastinal contours are within normal limits. Both lungs are clear. The visualized skeletal structures are unremarkable. IMPRESSION: Normal exam. Electronically Signed   By: Lorriane Shire M.D.   On: 12/29/2017 10:18   Ct Angio Chest Pe W And/or Wo Contrast  Result Date: 12/29/2017 CLINICAL DATA:  [name] left-sided chest pain last night. Left-sided chest heaviness today.  EXAM: CT ANGIOGRAPHY CHEST WITH CONTRAST TECHNIQUE: Multidetector CT imaging of the chest was performed using the standard protocol during bolus administration of intravenous contrast. Multiplanar CT image reconstructions and MIPs were obtained to evaluate the vascular anatomy. CONTRAST:  173mL ISOVUE-370 IOPAMIDOL (ISOVUE-370) INJECTION 76% COMPARISON:  Chest radiography same day.  Chest CT 01/17/2004. FINDINGS: Cardiovascular: Pulmonary arterial opacification is good. There are no pulmonary emboli. No aortic calcification is seen. The ascending aorta is slightly prominent at 3.7 cm. Heart size is normal. No coronary artery calcification is visible. Mediastinum/Nodes: Normal Lungs/Pleura: No pleural effusion. No pulmonary parenchymal lesion. No infiltrate, mass, collapse or other visible pathologic process. Upper Abdomen: Normal Musculoskeletal: Normal Review of the MIP images confirms the above findings. IMPRESSION: Diameter  of the ascending aorta 3.7 cm. Recommend annual imaging followup by CTA or MRA. This recommendation follows 2010 ACCF/AHA/AATS/ACR/ASA/SCA/SCAI/SIR/STS/SVM Guidelines for the Diagnosis and Management of Patients with Thoracic Aortic Disease. Circulation.2010; 121: K863-O177 Otherwise normal examination. No pulmonary emboli or other acute chest pathology. No cause of the presenting symptoms is identified. Electronically Signed   By: Nelson Chimes M.D.   On: 12/29/2017 12:36    Procedures Procedures (including critical care time)  Medications Ordered in ED Medications  sodium chloride 0.9 % injection (has no administration in time range)  iopamidol (ISOVUE-370) 76 % injection (has no administration in time range)  ketorolac (TORADOL) 30 MG/ML injection 30 mg (30 mg Intravenous Given 12/29/17 1039)  iopamidol (ISOVUE-370) 76 % injection 100 mL (100 mLs Intravenous Contrast Given 12/29/17 1201)     Initial Impression / Assessment and Plan / ED Course  I have reviewed the triage vital  signs and the nursing notes.  Pertinent labs & imaging results that were available during my care of the patient were reviewed by me and considered in my medical decision making (see chart for details).     Patient is overall well-appearing.  Doubt dissection.  Doubt ACS.  Likely left-sided pleurisy.  D-dimer was elevated therefore CT angiogram performed which demonstrates no evidence of pulmonary embolism.  Patient will be treated with anti-inflammatories for left-sided pleurisy.  A sending aortic enlargement was noted.  She will need follow-up with CT surgery and possible repeat CT imaging in 1 year to delineate any changes in the diameter of the ascending aorta.  Patient was informed of this and understands the importance of follow-up with the cardiothoracic surgery team for additional recommendations.  Final Clinical Impressions(s) / ED Diagnoses   Final diagnoses:  Pleuritic chest pain  Ascending aorta enlargement [facility])    ED Discharge Orders    None       Jola Schmidt, MD 12/29/17 1329

## 2017-12-29 NOTE — Telephone Encounter (Signed)
   Reason for Disposition . SEVERE chest pain    Patient is calling to report she had severe chest pain last night- she actually had her husband drive her to the hospital. It had subsided by the time she arrived- she left. Patient reports she feels fatigued and tired today. Advised patient she has to get checked.  Answer Assessment - Initial Assessment Questions 1. LOCATION: "Where does it hurt?"       Left chest 2. RADIATION: "Does the pain go anywhere else?" (e.g., into neck, jaw, arms, back)     no 3. ONSET: "When did the chest pain begin?" (Minutes, hours or days)      9:30 last night- with deep breath- lasted 45 minutes 4. PATTERN "Does the pain come and go, or has it been constant since it started?"  "Does it get worse with exertion?"      Constant while it lasted- did not get worse with exertion- but nothing relieved it 5. DURATION: "How long does it last" (e.g., seconds, minutes, hours)     45 minutes 6. SEVERITY: "How bad is the pain?"  (e.g., Scale 1-10; mild, moderate, or severe)    - MILD (1-3): doesn't interfere with normal activities     - MODERATE (4-7): interferes with normal activities or awakens from sleep    - SEVERE (8-10): excruciating pain, unable to do any normal activities       10 7. CARDIAC RISK FACTORS: "Do you have any history of heart problems or risk factors for heart disease?" (e.g., prior heart attack, angina; high blood pressure, diabetes, being overweight, high cholesterol, smoking, or strong family history of heart disease)     Family history 8. PULMONARY RISK FACTORS: "Do you have any history of lung disease?"  (e.g., blood clots in lung, asthma, emphysema, birth control pills)     Birth control 9. CAUSE: "What do you think is causing the chest pain?"     unknown 10. OTHER SYMPTOMS: "Do you have any other symptoms?" (e.g., dizziness, nausea, vomiting, sweating, fever, difficulty breathing, cough)       Light headed, diarrhea this morning 11. PREGNANCY:  "Is there any chance you are pregnant?" "When was your last menstrual period?"       OCP  Protocols used: CHEST PAIN-A-AH

## 2017-12-29 NOTE — Discharge Instructions (Signed)
Ibuprofen 600mg three times a day

## 2018-01-25 MED FILL — BLISOVI FE 1/20 1-20 MG-MCG: 1-20 | 84 days supply | Qty: 84 | Fill #4

## 2018-01-27 ENCOUNTER — Encounter: Payer: 59 | Admitting: Surgery

## 2018-02-03 DIAGNOSIS — Z1151 Encounter for screening for human papillomavirus (HPV): Secondary | ICD-10-CM | POA: Diagnosis not present

## 2018-02-03 DIAGNOSIS — Z6823 Body mass index (BMI) 23.0-23.9, adult: Secondary | ICD-10-CM | POA: Diagnosis not present

## 2018-02-03 DIAGNOSIS — Z1231 Encounter for screening mammogram for malignant neoplasm of breast: Secondary | ICD-10-CM | POA: Diagnosis not present

## 2018-02-03 DIAGNOSIS — Z01419 Encounter for gynecological examination (general) (routine) without abnormal findings: Secondary | ICD-10-CM | POA: Diagnosis not present

## 2018-02-03 LAB — HM MAMMOGRAPHY

## 2018-02-04 DIAGNOSIS — H5213 Myopia, bilateral: Secondary | ICD-10-CM | POA: Diagnosis not present

## 2018-02-04 DIAGNOSIS — D485 Neoplasm of uncertain behavior of skin: Secondary | ICD-10-CM | POA: Diagnosis not present

## 2018-02-24 HISTORY — PX: CARPAL TUNNEL RELEASE: SHX101

## 2018-02-26 DIAGNOSIS — Z1211 Encounter for screening for malignant neoplasm of colon: Secondary | ICD-10-CM | POA: Diagnosis not present

## 2018-02-26 DIAGNOSIS — Z1212 Encounter for screening for malignant neoplasm of rectum: Secondary | ICD-10-CM | POA: Diagnosis not present

## 2018-03-03 ENCOUNTER — Encounter: Payer: 59 | Admitting: Surgery

## 2018-03-04 ENCOUNTER — Other Ambulatory Visit: Payer: Self-pay | Admitting: *Deleted

## 2018-03-04 ENCOUNTER — Institutional Professional Consult (permissible substitution) (INDEPENDENT_AMBULATORY_CARE_PROVIDER_SITE_OTHER): Payer: 59 | Admitting: Cardiothoracic Surgery

## 2018-03-04 ENCOUNTER — Encounter: Payer: Self-pay | Admitting: Cardiothoracic Surgery

## 2018-03-04 VITALS — BP 110/67 | HR 65 | Resp 20 | Ht 66.0 in | Wt 145.0 lb

## 2018-03-04 DIAGNOSIS — I712 Thoracic aortic aneurysm, without rupture, unspecified: Secondary | ICD-10-CM

## 2018-03-04 DIAGNOSIS — R079 Chest pain, unspecified: Secondary | ICD-10-CM

## 2018-03-04 NOTE — Patient Instructions (Signed)

## 2018-03-04 NOTE — Progress Notes (Addendum)
[address]       [place] [address]             [phone]                    Krisi C Nyborg Ammon Medical Record #[id number] Date of Birth: [birth year]  Referring: Jola Schmidt, MD Primary Care: Eulas Post, MD Primary Cardiologist: No primary care provider on file.  Chief Complaint:    Chief Complaint  Patient presents with  . Thoracic Aortic Aneurysm    Surgical eval on ascending aorta 3.7 cm, CTA Chest  12/29/2017    History of Present Illness:    Hannah Galloway 53 y.o. female is seen in the office  today for evaluation of abnormal CT scan of the chest done last month in the emergency room.  The patient notes that at the time of her admission to the emergency room she had sudden onset of pleuritic type left upper anterior chest pain this waxed and waned for several hours.  The patient went to the emergency room the evening prior to her admission but before actually signing then felt better and went back home.  The following day at the urgent of her primary care doctor she went to [facility] emergency room and a CT scan was done.  Showed 3.7 cm mildly dilated a sending aorta without evidence of pulmonary emboli or aortic dissection.  She did not see cardiology at the time of the emergency room visit and was only given an appointment to cardiac surgery.   She has had no recurrent episodes of chest pain  She does give a family history of sudden death at home of her father at age 51 and also of her grandfather in his 41s he has had 1 uncle who experienced sudden death without known details, there is no documented history of aortic dissection or a sending aortic aneurysms.  No history of sudden death in her uncle father and grandfather are vague in details.   Current Activity/ Functional Status:  Patient is independent with mobility/ambulation, transfers, ADL's, IADL's.   Zubrod Score: At the time of surgery this patient's most appropriate  activity status/level should be described as: [x]     0    Normal activity, no symptoms []     1    Restricted in physical strenuous activity but ambulatory, able to do out light work []     2    Ambulatory and capable of self care, unable to do work activities, up and about               >50 % of waking hours                              []     3    Only limited self care, in bed greater than 50% of waking hours []     4    Completely disabled, no self care, confined to bed or chair []     5    Moribund   Past Medical History:  Diagnosis Date  . Ascending aortic aneurysm ([place])   . Glaucoma     Past Surgical History:  Procedure Laterality Date  . TONSILLECTOMY AND ADENOIDECTOMY  1974    Family History  Problem Relation Age of Onset  . Stroke Father   . Heart disease Father   . Diabetes Father  type ll  . Lymphoma Brother 75       t-call  . Cancer Brother 38       T cell lymphoma     Social History   Tobacco Use  Smoking Status Never Smoker  Smokeless Tobacco Never Used    Social History   Substance and Sexual Activity  Alcohol Use No     No Known Allergies  Current Outpatient Medications  Medication Sig Dispense Refill  . Benzyl Alcohol-Camphor-Menthol (IVY-DRY SUPER CONTINUOUS SPRAY) 10-0.5-0.25 % AERO Apply 1 spray topically every 2 (two) hours as needed (itching).    . diphenhydrAMINE (BENADRYL) 25 mg capsule Take 25 mg by mouth every 6 (six) hours as needed for itching.    . fluconazole (DIFLUCAN) 150 MG tablet Take 1 tablet (150 mg total) by mouth once. 1 tablet 0  . ibuprofen (ADVIL,MOTRIN) 200 MG tablet Take 400 mg by mouth every 6 (six) hours as needed for mild pain.    Marland Kitchen norethindrone-ethinyl estradiol (JUNEL FE,GILDESS FE,LOESTRIN FE) 1-20 MG-MCG tablet Take 1 tablet by mouth daily.  4   No current facility-administered medications for this visit.     Pertinent items are noted in HPI.   Review of Systems:     Cardiac Review of Systems: [Y] =  yes  or   [ N ] = no   Chest Pain [ y   ]  Resting SOB [ n  ] Exertional SOB  [n  ]  Orthopnea [  n]   Pedal Edema [  n ]    Palpitations [n  ] Syncope  [n  ]   Presyncope [n   ]   General Review of Systems: [Y] = yes [  ]=no Constitional: recent weight change [  ];  Wt loss over the last 3 months [   ] anorexia [  ]; fatigue [  ]; nausea [  ]; night sweats [  ]; fever [  ]; or chills [  ];           Eye : blurred vision [  ]; diplopia [   ]; vision changes [  ];  Amaurosis fugax[  ]; Resp: cough [  ];  wheezing[  ];  hemoptysis[  ]; shortness of breath[  ]; paroxysmal nocturnal dyspnea[  ]; dyspnea on exertion[  ]; or orthopnea[  ];  GI:  gallstones[  ], vomiting[  ];  dysphagia[  ]; melena[  ];  hematochezia [  ]; heartburn[  ];   Hx of  Colonoscopy[  ]; GU: kidney stones [  ]; hematuria[  ];   dysuria [  ];  nocturia[  ];  history of     obstruction [  ]; urinary frequency [  ]             Skin: rash, swelling[  ];, hair loss[  ];  peripheral edema[  ];  or itching[  ]; Musculosketetal: myalgias[  ];  joint swelling[  ];  joint erythema[  ];  joint pain[  ];  back pain[  ];  Heme/Lymph: bruising[  ];  bleeding[  ];  anemia[  ];  Neuro: TIA[  ];  headaches[  ];  stroke[  ];  vertigo[  ];  seizures[  ];   paresthesias[  ];  difficulty walking[  ];  Psych:depression[  ]; anxiety[  ];  Endocrine: diabetes[  ];  thyroid dysfunction[  ];  Immunizations: Flu up to date [ y ]; Pneumococcal up  to date [  ];  Other:     PHYSICAL EXAMINATION: BP 110/67   Pulse 65   Resp 20   Ht 5\' 6"  (1.676 m)   Wt 145 lb (65.8 kg)   SpO2 97% Comment: RA  BMI 23.40 kg/m  General appearance: alert, cooperative and appears stated age Head: Normocephalic, without obvious abnormality, atraumatic Neck: no adenopathy, no carotid bruit, no JVD, supple, symmetrical, trachea midline and thyroid not enlarged, symmetric, no tenderness/mass/nodules Lymph nodes: Cervical, supraclavicular, and axillary nodes  normal. Resp: clear to auscultation bilaterally Back: symmetric, no curvature. ROM normal. No CVA tenderness. Cardio: regular rate and rhythm, S1, S2 normal, no murmur, click, rub or gallop GI: soft, non-tender; bowel sounds normal; no masses,  no organomegaly Extremities: extremities normal, atraumatic, no cyanosis or edema Neurologic: Grossly normal  Diagnostic Studies & Laboratory data:     Recent Radiology Findings:   CLINICAL DATA:  Sharp left-sided chest pain last night. Left-sided chest heaviness today.  EXAM: CT ANGIOGRAPHY CHEST WITH CONTRAST  TECHNIQUE: Multidetector CT imaging of the chest was performed using the standard protocol during bolus administration of intravenous contrast. Multiplanar CT image reconstructions and MIPs were obtained to evaluate the vascular anatomy.  CONTRAST:  158mL ISOVUE-370 IOPAMIDOL (ISOVUE-370) INJECTION 76%  COMPARISON:  Chest radiography same day.  Chest CT 01/17/2004.  FINDINGS: Cardiovascular: Pulmonary arterial opacification is good. There are no pulmonary emboli. No aortic calcification is seen. The ascending aorta is slightly prominent at 3.7 cm. Heart size is normal. No coronary artery calcification is visible.  Mediastinum/Nodes: Normal  Lungs/Pleura: No pleural effusion. No pulmonary parenchymal lesion. No infiltrate, mass, collapse or other visible pathologic process.  Upper Abdomen: Normal  Musculoskeletal: Normal  Review of the MIP images confirms the above findings.  IMPRESSION: Diameter of the ascending aorta 3.7 cm. Recommend annual imaging followup by CTA or MRA. This recommendation follows 2010 ACCF/AHA/AATS/ACR/ASA/SCA/SCAI/SIR/STS/SVM Guidelines for the Diagnosis and Management of Patients with Thoracic Aortic Disease. Circulation.2010; 121: B151-V616  Otherwise normal examination. No pulmonary emboli or other acute chest pathology. No cause of the presenting symptoms is  identified.   Electronically Signed   By: Nelson Chimes M.D.   On: 12/29/2017 12:36 I have independently reviewed the above radiology studies  and reviewed the findings with the patient.   Recent Lab Findings: Lab Results  Component Value Date   WBC 6.1 12/29/2017   HGB 12.3 12/29/2017   HCT 39.0 12/29/2017   PLT 262 12/29/2017   GLUCOSE 95 12/29/2017   ALT 32 08/25/2007   AST 22 08/25/2007   NA 138 12/29/2017   K 3.8 12/29/2017   CL 106 12/29/2017   CREATININE 0.85 12/29/2017   BUN 11 12/29/2017   CO2 23 12/29/2017   INR 1.0 08/25/2007   Aortic Size Index=    3.7      /Body surface area is 1.75 meters squared. =2.11  < 2.75 cm/m2      4% risk per year 2.75 to 4.25          8% risk per year > 4.25 cm/m2    20% risk per year     Assessment / Plan:   #1 mildly dilated ascending aorta-recent CT a of the chest shows no evidence of dissection or pulmonary embolus, a sending aorta 3.7 cm, status of aortic valve bicuspid versus tricuspid unknown, no morphologic findings suggestive of genetic aortic disease. #2 family history of sudden death in father uncle and paternal grandfather, no further details are known #  3 atypical chest pain precipitating admission to the emergency room but without any definitive cause-labeled pleurisy    I discussed the diagnosis of mild dilatation of the ascending aorta, cautioned her about good blood pressure control, avoiding strenuous lifting, avoid quinolones.  We will refer her to cardiology for evaluation and also for echocardiogram.  She knows Dr. Stanford Breed and would like to see him.  I will plan to see her back in 1 year with a MRA of the chest.    I  spent 60 minutes with  the patient face to face and greater then 50% of the time was spent in counseling and coordination of care.    Grace Isaac MD      [address] [place] [address] Office [phone]   Beeper [phone]  03/04/2018 12:24 PM  January 22 the  patient brought medical records from [facility] in regard to her father who had died in 06-08-11 suddenly.  On review of these records the patient's father had ischemic cardiomyopathy, no history of dilated aorta by echocardiogram, no dilated root, his aortic valve was a trileaflet valve noted on echo.

## 2018-03-12 ENCOUNTER — Other Ambulatory Visit: Payer: Self-pay

## 2018-03-12 ENCOUNTER — Ambulatory Visit (HOSPITAL_COMMUNITY): Payer: 59 | Attending: Cardiovascular Disease

## 2018-03-12 DIAGNOSIS — R079 Chest pain, unspecified: Secondary | ICD-10-CM

## 2018-03-12 DIAGNOSIS — I712 Thoracic aortic aneurysm, without rupture, unspecified: Secondary | ICD-10-CM

## 2018-03-17 ENCOUNTER — Telehealth: Payer: Self-pay | Admitting: Cardiothoracic Surgery

## 2018-03-17 NOTE — Telephone Encounter (Signed)
Erroneous encounter

## 2018-03-24 DIAGNOSIS — R079 Chest pain, unspecified: Secondary | ICD-10-CM | POA: Diagnosis not present

## 2018-03-24 DIAGNOSIS — R195 Other fecal abnormalities: Secondary | ICD-10-CM | POA: Diagnosis not present

## 2018-03-24 MED FILL — PLENVU 140 GM SOLR: 140 | 1 days supply | Qty: 3 | Fill #0

## 2018-04-08 ENCOUNTER — Encounter: Payer: Self-pay | Admitting: Cardiology

## 2018-04-19 NOTE — Progress Notes (Signed)
Referring-Ed Allegra Lai, MD Reason for referral-chest pain and thoracic aortic aneurysm  HPI: 53 year old female for evaluation of chest pain and thoracic aortic aneurysm at request of Ceasar Mons, MD.  Echocardiogram January 2020 showed normal LV function.  CTA November 2019 showed prominent ascending aorta at 3.7 cm.  In November patient had chest pain in the left breast and substernal area.  It was described as a stabbing sensation without radiation.  Some increase with inspiration and felt like she could not take a deep breath.  She had some diaphoresis but no nausea or vomiting.  Symptom lasted approximately 30 minutes.  She had some residual pain the following day and was seen in the emergency room and troponin was negative.  CT was performed as outlined above.  She has not had chest pain since then.  She exercises routinely and denies dyspnea on exertion, orthopnea, PND, pedal edema or syncope.  She occasionally feels her heart "beat hard" but no palpitations.  Cardiology asked to evaluate.  Current Outpatient Medications  Medication Sig Dispense Refill  . Benzyl Alcohol-Camphor-Menthol (IVY-DRY SUPER CONTINUOUS SPRAY) 10-0.5-0.25 % AERO Apply 1 spray topically every 2 (two) hours as needed (itching).    . diphenhydrAMINE (BENADRYL) 25 mg capsule Take 25 mg by mouth every 6 (six) hours as needed for itching.    . fluconazole (DIFLUCAN) 150 MG tablet Take 1 tablet (150 mg total) by mouth once. 1 tablet 0  . ibuprofen (ADVIL,MOTRIN) 200 MG tablet Take 400 mg by mouth every 6 (six) hours as needed for mild pain.     No current facility-administered medications for this visit.     Allergies  Allergen Reactions  . Quinolones     Patient was warned about not using Cipro and similar antibiotics. Recent studies have raised concern that fluoroquinolone antibiotics could be associated with an increased risk of aortic aneurysm Fluoroquinolones have non-antimicrobial properties that might  jeopardise the integrity of the extracellular matrix of the vascular wall In a  propensity score matched cohort study in [place], there was a 66% increased rate of aortic aneurysm or dissection associated with oral fluoroquinolone use, compared wit     Past Medical History:  Diagnosis Date  . Ascending aortic aneurysm ([place])   . Glaucoma     Past Surgical History:  Procedure Laterality Date  . BACK SURGERY    . TONSILLECTOMY AND ADENOIDECTOMY  1974    Social History   Socioeconomic History  . Marital status: Married    Spouse name: Not on file  . Number of children: 2  . Years of education: Not on file  . Highest education level: Not on file  Occupational History    Comment: Lab   Social Needs  . Financial resource strain: Not on file  . Food insecurity:    Worry: Not on file    Inability: Not on file  . Transportation needs:    Medical: Not on file    Non-medical: Not on file  Tobacco Use  . Smoking status: Never Smoker  . Smokeless tobacco: Never Used  Substance and Sexual Activity  . Alcohol use: No  . Drug use: Never  . Sexual activity: Not on file  Lifestyle  . Physical activity:    Days per week: Not on file    Minutes per session: Not on file  . Stress: Not on file  Relationships  . Social connections:    Talks on phone: Not on file    Gets together: Not  on file    Attends religious service: Not on file    Active member of club or organization: Not on file    Attends meetings of clubs or organizations: Not on file    Relationship status: Not on file  . Intimate partner violence:    Fear of current or ex partner: Not on file    Emotionally abused: Not on file    Physically abused: Not on file    Forced sexual activity: Not on file  Other Topics Concern  . Not on file  Social History Narrative  . Not on file    Family History  Problem Relation Age of Onset  . Stroke Father   . Heart disease Father   . Diabetes Father        type ll  . Heart  attack Father   . Hypertension Mother   . Lymphoma Brother 42       t-call  . Cancer Brother 69       T cell lymphoma    ROS: no fevers or chills, productive cough, hemoptysis, dysphasia, odynophagia, melena, hematochezia, dysuria, hematuria, rash, seizure activity, orthopnea, PND, pedal edema, claudication. Remaining systems are negative.  Physical Exam:   Blood pressure 119/74, pulse 73, height 5\' 6"  (1.676 m), weight 148 lb (67.1 kg).  General:  Well developed/well nourished in NAD Skin warm/dry Patient not depressed No peripheral clubbing Back-normal HEENT-normal/normal eyelids Neck supple/normal carotid upstroke bilaterally; no bruits; no JVD; no thyromegaly chest - CTA/ normal expansion CV - RRR/normal S1 and S2; no murmurs, rubs or gallops;  PMI nondisplaced Abdomen -NT/ND, no HSM, no mass, + bowel sounds, no bruit 2+ femoral pulses, no bruits Ext-no edema, chords, 2+ DP Neuro-grossly nonfocal  ECG -normal sinus rhythm at a rate of 73, no ST changes.  Left atrial enlargement.  Personally reviewed  A/P  1 chest pain-symptoms atypical.  Pleuritic component.  CTA showed no pulmonary embolus.  Troponin was normal.  Electrocardiogram shows no ST changes.  She does have a strong family history of coronary disease.  I will arrange a calcium score.  If low she will continue with lifestyle modification.  She exercises routinely and has no other risk factors other than family history.  If elevated we may need to pursue a functional study.  2 dilated thoracic aorta-measured 3.7 cm on previous CTA.  We will plan to repeat MRA November 2020.  Kirk Ruths, MD

## 2018-04-26 ENCOUNTER — Telehealth: Payer: Self-pay | Admitting: Cardiology

## 2018-04-26 ENCOUNTER — Encounter: Payer: Self-pay | Admitting: Cardiology

## 2018-04-26 ENCOUNTER — Ambulatory Visit (INDEPENDENT_AMBULATORY_CARE_PROVIDER_SITE_OTHER): Payer: 59 | Admitting: Cardiology

## 2018-04-26 VITALS — BP 119/74 | HR 73 | Ht 66.0 in | Wt 148.0 lb

## 2018-04-26 DIAGNOSIS — I712 Thoracic aortic aneurysm, without rupture, unspecified: Secondary | ICD-10-CM

## 2018-04-26 DIAGNOSIS — R072 Precordial pain: Secondary | ICD-10-CM | POA: Diagnosis not present

## 2018-04-26 NOTE — Telephone Encounter (Signed)
New Message:     Pt saw Dr Stanford Breed this morning. She said she forgot to tell you that she needs clearance for her Colonoscopy,

## 2018-04-26 NOTE — Patient Instructions (Signed)
Medication Instructions:  NO CHANGE If you need a refill on your cardiac medications before your next appointment, please call your pharmacy.   Lab work: If you have labs (blood work) drawn today and your tests are completely normal, you will receive your results only by: Marland Kitchen MyChart Message (if you have MyChart) OR . A paper copy in the mail If you have any lab test that is abnormal or we need to change your treatment, we will call you to review the results.  Testing/Procedures: CARDIAC CALCIUM SCORING [address]  Follow-Up: At [facility], you and your health needs are our priority.  As part of our continuing mission to provide you with exceptional heart care, we have created designated Provider Care Teams.  These Care Teams include your primary Cardiologist (physician) and Advanced Practice Providers (APPs -  Physician Assistants and Nurse Practitioners) who all work together to provide you with the care you need, when you need it. You will need a follow up appointment in 8 months.  Please call our office 2 months in advance to schedule this appointment.  You may see Kirk Ruths MD or one of the following Advanced Practice Providers on your designated Care Team:   Kerin Ransom, PA-C Roby Lofts, [place] . Sande Rives, PA-C

## 2018-04-26 NOTE — Telephone Encounter (Signed)
Spoke with pt, Dr Carol Ada is doing the procedure the end of march and needs something faxed to him at [phone]. Will forward for dr Stanford Breed review

## 2018-04-27 ENCOUNTER — Telehealth: Payer: Self-pay

## 2018-04-27 NOTE — Telephone Encounter (Signed)
Will forward this note to Dr Benson Norway.

## 2018-04-27 NOTE — Telephone Encounter (Signed)
   [facility] Pre-operative Risk Assessment    Request for surgical clearance:  1. What type of surgery is being performed? Colonoscopy  2. When is this surgery scheduled? 05/20/18  3. What type of clearance is required (medical clearance vs. Pharmacy clearance to hold med vs. Both)? Medical  4. Are there any medications that need to be held prior to surgery and how long? None  5. Practice name and name of physician performing surgery? [facility]  6. What is your office phone number [phone]   7.   What is your office fax number [phone]  8.   Anesthesia type  propofol   Kathyrn Lass 04/27/2018, 6:11 PM  _________________________________________________________________   (provider comments below)

## 2018-04-27 NOTE — Telephone Encounter (Signed)
[place] for colonoscopy Kirk Ruths

## 2018-04-28 NOTE — Telephone Encounter (Signed)
Hannah Galloway for colonoscopy Kirk Ruths

## 2018-04-28 NOTE — Telephone Encounter (Signed)
   Primary Cardiologist: Kirk Ruths, MD  Chart reviewed as part of pre-operative protocol coverage.   Per Dr. Stanford Breed "[facility] for colonoscopy" who saw the patient 2 days ago. Dr. Stanford Breed, do we need to wait until get calcium score?   [employer], [place] 04/28/2018, 9:32 AM

## 2018-04-30 ENCOUNTER — Encounter (HOSPITAL_COMMUNITY): Payer: Self-pay

## 2018-04-30 ENCOUNTER — Ambulatory Visit (INDEPENDENT_AMBULATORY_CARE_PROVIDER_SITE_OTHER)
Admission: RE | Admit: 2018-04-30 | Discharge: 2018-04-30 | Disposition: A | Discharge status: Home / Self Care | Payer: Self-pay | Source: Ambulatory Visit | Attending: Cardiology | Admitting: Cardiology

## 2018-04-30 DIAGNOSIS — R072 Precordial pain: Secondary | ICD-10-CM

## 2018-06-07 ENCOUNTER — Ambulatory Visit (INDEPENDENT_AMBULATORY_CARE_PROVIDER_SITE_OTHER): Payer: 59 | Admitting: Family Medicine

## 2018-06-07 ENCOUNTER — Other Ambulatory Visit: Payer: Self-pay

## 2018-06-07 DIAGNOSIS — R202 Paresthesia of skin: Secondary | ICD-10-CM | POA: Diagnosis not present

## 2018-06-07 NOTE — Progress Notes (Signed)
Patient ID: Hannah Galloway, female   DOB: 1965-10-09, 53 y.o.   MRN: [id number]  Virtual Visit via Video Note  I connected with Hannah Galloway on 06/07/18 at  4:15 PM EDT by a video enabled telemedicine application and verified that I am speaking with the correct person using two identifiers.  Location patient: home Location provider:work or home office Persons participating in the virtual visit: patient, provider  I discussed the limitations of evaluation and management by telemedicine and the availability of in person appointments. The patient expressed understanding and agreed to proceed.   HPI: Patient is complaining of numbness right hand greater than left.  This is predominantly involving the thumb, index, and middle finger.  No neck pain.  No weakness.  She has been doing a lot of painting recently and thinks that may have exacerbated.  Symptoms are worse at night.  She has achy discomfort in the hand at night.  Also does a fair amount of typing.  Denies any headache, visual change, speech change, or any other weakness  Patient had atypical chest pain last fall.  She went to the ED.  X-rays revealed ascending aorta 3.7 cm diameter.  She has been seen by thoracic surgery and will be monitored yearly. Denies any recent chest pain   ROS: See pertinent positives and negatives per HPI.  Past Medical History:  Diagnosis Date  . Ascending aortic aneurysm ([place])   . Glaucoma     Past Surgical History:  Procedure Laterality Date  . BACK SURGERY    . TONSILLECTOMY AND ADENOIDECTOMY  1974    Family History  Problem Relation Age of Onset  . Stroke Father   . Heart disease Father   . Diabetes Father        type ll  . Heart attack Father   . Hypertension Mother   . Lymphoma Brother 59       t-call  . Cancer Brother 25       T cell lymphoma    SOCIAL HX: Non-smoker.  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  She is married.   Current Outpatient Medications:  .  Benzyl  Alcohol-Camphor-Menthol (IVY-DRY SUPER CONTINUOUS SPRAY) 10-0.5-0.25 % AERO, Apply 1 spray topically every 2 (two) hours as needed (itching)., Disp: , Rfl:  .  diphenhydrAMINE (BENADRYL) 25 mg capsule, Take 25 mg by mouth every 6 (six) hours as needed for itching., Disp: , Rfl:  .  fluconazole (DIFLUCAN) 150 MG tablet, Take 1 tablet (150 mg total) by mouth once., Disp: 1 tablet, Rfl: 0 .  ibuprofen (ADVIL,MOTRIN) 200 MG tablet, Take 400 mg by mouth every 6 (six) hours as needed for mild pain., Disp: , Rfl:   EXAM:  VITALS per patient if applicable:  GENERAL: alert, oriented, appears well and in no acute distress  HEENT: atraumatic, conjunttiva clear, no obvious abnormalities on inspection of external nose and ears  NECK: normal movements of the head and neck  LUNGS: on inspection no signs of respiratory distress, breathing rate appears normal, no obvious gross SOB, gasping or wheezing  CV: no obvious cyanosis  MS: moves all visible extremities without noticeable abnormality  PSYCH/NEURO: pleasant and cooperative, no obvious depression or anxiety, speech and thought processing grossly intact  ASSESSMENT AND PLAN:  Discussed the following assessment and plan:  Paresthesias right hand greater than left.  Distribution of symptoms suggests median nerve.  Suspect carpal tunnel syndrome -Recommend wrist splint right wrist at night and wear as much  as possible during the day -Avoid overuse activities as much as possible -Touch base if symptoms not improving over the next few weeks and follow-up sooner for any weakness or progressive symptoms     I discussed the assessment and treatment plan with the patient. The patient was provided an opportunity to ask questions and all were answered. The patient agreed with the plan and demonstrated an understanding of the instructions.   The patient was advised to call back or seek an in-person evaluation if the symptoms worsen or if the condition  fails to improve as anticipated   Carolann Littler, MD

## 2018-07-08 DIAGNOSIS — D125 Benign neoplasm of sigmoid colon: Secondary | ICD-10-CM | POA: Diagnosis not present

## 2018-07-08 DIAGNOSIS — R195 Other fecal abnormalities: Secondary | ICD-10-CM | POA: Diagnosis not present

## 2018-07-08 DIAGNOSIS — D123 Benign neoplasm of transverse colon: Secondary | ICD-10-CM | POA: Diagnosis not present

## 2018-07-20 ENCOUNTER — Encounter: Payer: Self-pay | Admitting: Family Medicine

## 2018-07-20 ENCOUNTER — Other Ambulatory Visit: Payer: Self-pay

## 2018-07-20 DIAGNOSIS — R202 Paresthesia of skin: Secondary | ICD-10-CM

## 2018-07-30 DIAGNOSIS — G5603 Carpal tunnel syndrome, bilateral upper limbs: Secondary | ICD-10-CM | POA: Diagnosis not present

## 2018-07-30 DIAGNOSIS — M79641 Pain in right hand: Secondary | ICD-10-CM | POA: Diagnosis not present

## 2018-07-30 DIAGNOSIS — M79642 Pain in left hand: Secondary | ICD-10-CM | POA: Diagnosis not present

## 2018-08-09 DIAGNOSIS — G5603 Carpal tunnel syndrome, bilateral upper limbs: Secondary | ICD-10-CM | POA: Insufficient documentation

## 2018-08-09 HISTORY — DX: Carpal tunnel syndrome, bilateral upper limbs: G56.03

## 2018-08-17 DIAGNOSIS — G5603 Carpal tunnel syndrome, bilateral upper limbs: Secondary | ICD-10-CM | POA: Diagnosis not present

## 2018-09-06 DIAGNOSIS — G5601 Carpal tunnel syndrome, right upper limb: Secondary | ICD-10-CM | POA: Diagnosis not present

## 2018-09-06 MED FILL — HYDROCODON-APAP 5-325: 5-325 | 7 days supply | Qty: 21 | Fill #0

## 2018-09-21 DIAGNOSIS — G5601 Carpal tunnel syndrome, right upper limb: Secondary | ICD-10-CM | POA: Diagnosis not present

## 2018-09-21 DIAGNOSIS — Z4789 Encounter for other orthopedic aftercare: Secondary | ICD-10-CM | POA: Diagnosis not present

## 2018-10-22 DIAGNOSIS — M79641 Pain in right hand: Secondary | ICD-10-CM | POA: Diagnosis not present

## 2018-11-02 DIAGNOSIS — M79641 Pain in right hand: Secondary | ICD-10-CM | POA: Diagnosis not present

## 2018-12-09 ENCOUNTER — Telehealth: Payer: Self-pay | Admitting: *Deleted

## 2018-12-09 DIAGNOSIS — I712 Thoracic aortic aneurysm, without rupture, unspecified: Secondary | ICD-10-CM

## 2018-12-09 NOTE — Telephone Encounter (Signed)
Left message for pt to call, her scan is due after 12-30-2018. She is to call back to discuss scheduling.

## 2018-12-09 NOTE — Telephone Encounter (Signed)
-----   Message from Corinna Lines sent at 12/06/2018  1:12 PM EDT ----- Regarding: MRA   Hannah Galloway  This patient is wanting to get her MRA scheduled, but, there is no order in the system.    Thanks [facility]

## 2018-12-10 ENCOUNTER — Encounter: Payer: Self-pay | Admitting: Family Medicine

## 2018-12-10 ENCOUNTER — Other Ambulatory Visit: Payer: Self-pay

## 2018-12-10 ENCOUNTER — Ambulatory Visit (INDEPENDENT_AMBULATORY_CARE_PROVIDER_SITE_OTHER): Payer: 59 | Admitting: Family Medicine

## 2018-12-10 VITALS — BP 98/72 | HR 66 | Temp 98.1°F | Wt 149.0 lb

## 2018-12-10 DIAGNOSIS — Z0000 Encounter for general adult medical examination without abnormal findings: Secondary | ICD-10-CM

## 2018-12-10 MED ORDER — MOMETASONE FUROATE 0.1 % EX SOLN: Freq: Every day | CUTANEOUS | 1 refills | Status: DC

## 2018-12-10 MED FILL — MOMETASONE FUROATE 0.1% SOL: 0.1 | 20 days supply | Qty: 60 | Fill #0

## 2018-12-10 NOTE — Progress Notes (Signed)
Subjective:     Patient ID: Hannah Galloway, female   DOB: 1965/03/11, 53 y.o.   MRN: [id number]  HPI Hannah Galloway is seen for physical exam.  Last fall she had episode of severe chest pain.  She went to the ER and had work-up there which included slightly high D-dimer.  CT angiogram was done which showed slightly dilated ascending aorta 3.7 cm.  She was referred to CVTS and cardiology.  Her father died at 28 of MI complications.  Hannah Galloway had coronary calcium score which came back 0.  Echocardiogram otherwise unremarkable.  She plans to get yearly screening of her aneurysm.  Generally very healthy.  Has never smoked.  She sees gynecologist yearly.  Tetanus and flu vaccine are up-to-date.  She had colonoscopy last year which apparently showed some polyps with recommended 3-year follow-up.  She had carpal tunnel surgery during the past year  She has 1 son is married and lives here locally and her youngest son just moved to [place] for job in [place] in the [place] area  Past Medical History:  Diagnosis Date  . Ascending aortic aneurysm ([place])   . Glaucoma    Past Surgical History:  Procedure Laterality Date  . BACK SURGERY    . TONSILLECTOMY AND ADENOIDECTOMY  1974    reports that she has never smoked. She has never used smokeless tobacco. She reports that she does not drink alcohol or use drugs. family history includes Cancer (age of onset: 78) in her brother; Diabetes in her father; Heart attack in her father; Heart disease in her father; Hypertension in her mother; Lymphoma (age of onset: 47) in her brother; Stroke in her father. Allergies  Allergen Reactions  . Quinolones     Patient was warned about not using Cipro and similar antibiotics. Recent studies have raised concern that fluoroquinolone antibiotics could be associated with an increased risk of aortic aneurysm Fluoroquinolones have non-antimicrobial properties that might jeopardise the integrity of the extracellular matrix of the vascular  wall In a  propensity score matched cohort study in [place], there was a 66% increased rate of aortic aneurysm or dissection associated with oral fluoroquinolone use, compared wit     Review of Systems  Constitutional: Negative for activity change, appetite change, fatigue, fever and unexpected weight change.  HENT: Negative for ear pain, hearing loss, sore throat and trouble swallowing.   Eyes: Negative for visual disturbance.  Respiratory: Negative for cough and shortness of breath.   Cardiovascular: Negative for chest pain and palpitations.  Gastrointestinal: Negative for abdominal pain, blood in stool, constipation and diarrhea.  Genitourinary: Negative for dysuria and hematuria.  Musculoskeletal: Negative for arthralgias, back pain and myalgias.  Skin: Negative for rash.  Neurological: Negative for dizziness, syncope and headaches.  Hematological: Negative for adenopathy.  Psychiatric/Behavioral: Negative for confusion and dysphoric mood.       Objective:   Physical Exam Constitutional:      Appearance: She is well-developed.  HENT:     Head: Normocephalic and atraumatic.     Ears:     Comments: Minimal dry eczematous flaking in the ear canals Eyes:     Pupils: Pupils are equal, round, and reactive to light.  Neck:     Musculoskeletal: Normal range of motion and neck supple.     Thyroid: No thyromegaly.  Cardiovascular:     Rate and Rhythm: Normal rate and regular rhythm.     Heart sounds: Normal heart sounds. No murmur.  Pulmonary:  Effort: No respiratory distress.     Breath sounds: Normal breath sounds. No wheezing or rales.  Abdominal:     General: Bowel sounds are normal. There is no distension.     Palpations: Abdomen is soft. There is no mass.     Tenderness: There is no abdominal tenderness. There is no guarding or rebound.  Musculoskeletal: Normal range of motion.  Lymphadenopathy:     Cervical: No cervical adenopathy.  Skin:    Findings: No rash.   Neurological:     Mental Status: She is alert and oriented to person, place, and time.     Cranial Nerves: No cranial nerve deficit.     Deep Tendon Reflexes: Reflexes normal.  Psychiatric:        Behavior: Behavior normal.        Thought Content: Thought content normal.        Judgment: Judgment normal.        Assessment:     Generally healthy 53 year old female.  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

## 2018-12-13 ENCOUNTER — Other Ambulatory Visit: Payer: Self-pay | Admitting: Family Medicine

## 2018-12-13 DIAGNOSIS — Z0000 Encounter for general adult medical examination without abnormal findings: Secondary | ICD-10-CM | POA: Diagnosis not present

## 2018-12-13 DIAGNOSIS — Z7689 Persons encountering health services in other specified circumstances: Secondary | ICD-10-CM | POA: Diagnosis not present

## 2018-12-14 LAB — HEPATIC FUNCTION PANEL
ALT: 15 IU/L (ref 0–32)
AST: 20 IU/L (ref 0–40)
Albumin: 4.3 g/dL (ref 3.8–4.9)
Alkaline Phosphatase: 94 IU/L (ref 39–117)
Bilirubin Total: 0.4 mg/dL (ref 0.0–1.2)
Bilirubin, Direct: 0.1 mg/dL (ref 0.00–0.40)
Total Protein: 6.7 g/dL (ref 6.0–8.5)

## 2018-12-14 LAB — LIPID PANEL W/O CHOL/HDL RATIO
Cholesterol, Total: 220 mg/dL — ABNORMAL HIGH (ref 100–199)
HDL: 81 mg/dL (ref >39)
LDL Chol Calc (NIH): 130 mg/dL — ABNORMAL HIGH (ref 0–99)
Triglycerides: 54 mg/dL (ref 0–149)
VLDL Cholesterol Cal: 9 mg/dL (ref 5–40)

## 2018-12-15 NOTE — Telephone Encounter (Signed)
Patient is calling back to discuss the MRA

## 2018-12-15 NOTE — Telephone Encounter (Signed)
Spoke with pt, order for MRA placed for scheduling at [facility]. Also follow up appointment with the app per patient request to get in before the end of the year.

## 2018-12-17 ENCOUNTER — Encounter: Payer: Self-pay | Admitting: Family Medicine

## 2018-12-28 DIAGNOSIS — G5601 Carpal tunnel syndrome, right upper limb: Secondary | ICD-10-CM | POA: Diagnosis not present

## 2019-01-07 ENCOUNTER — Ambulatory Visit
Admission: RE | Admit: 2019-01-07 | Discharge: 2019-01-07 | Disposition: A | Discharge status: Home / Self Care | Payer: 59 | Source: Ambulatory Visit | Attending: Cardiology | Admitting: Cardiology

## 2019-01-07 ENCOUNTER — Other Ambulatory Visit: Payer: Self-pay

## 2019-01-07 DIAGNOSIS — I712 Thoracic aortic aneurysm, without rupture, unspecified: Secondary | ICD-10-CM

## 2019-01-07 MED ORDER — GADOBENATE DIMEGLUMINE 529 MG/ML IV SOLN
13.0000 mL | Freq: Once | INTRAVENOUS | Status: AC | PRN
  Administered 2019-01-07: 12:00:00 13 mL via INTRAVENOUS

## 2019-01-12 ENCOUNTER — Other Ambulatory Visit: Payer: Self-pay

## 2019-01-12 ENCOUNTER — Ambulatory Visit (INDEPENDENT_AMBULATORY_CARE_PROVIDER_SITE_OTHER): Payer: 59 | Admitting: Cardiology

## 2019-01-12 ENCOUNTER — Encounter: Payer: Self-pay | Admitting: Cardiology

## 2019-01-12 DIAGNOSIS — I712 Thoracic aortic aneurysm, without rupture, unspecified: Secondary | ICD-10-CM | POA: Insufficient documentation

## 2019-01-12 NOTE — Progress Notes (Signed)
Please let Ms Cooley know Dr Stanford Breed can see her in two years- he will decide then if she needs another MRA.  Kerin Ransom PA-C 01/12/2019 4:39 PM

## 2019-01-12 NOTE — Progress Notes (Signed)
Okay to follow-up in 2 years. Hannah Galloway

## 2019-01-12 NOTE — Patient Instructions (Signed)
Medication Instructions:  Your physician recommends that you continue on your current medications as directed. Please refer to the Current Medication list given to you today. *If you need a refill on your cardiac medications before your next appointment, please call your pharmacy*  Lab Work: None  If you have labs (blood work) drawn today and your tests are completely normal, you will receive your results only by: Marland Kitchen MyChart Message (if you have MyChart) OR . A paper copy in the mail If you have any lab test that is abnormal or we need to change your treatment, we will call you to review the results.  Testing/Procedures: None   Follow-Up: At [facility], you and your health needs are our priority.  As part of our continuing mission to provide you with exceptional heart care, we have created designated Provider Care Teams.  These Care Teams include your primary Cardiologist (physician) and Advanced Practice Providers (APPs -  Physician Assistants and Nurse Practitioners) who all work together to provide you with the care you need, when you need it.  Your next appointment:   24 month(s)  The format for your next appointment:   In Person  Provider:   Kirk Ruths, MD  Other Instructions

## 2019-01-12 NOTE — Assessment & Plan Note (Signed)
3.7 cm- no change on Nov 2020 MRA

## 2019-01-12 NOTE — Progress Notes (Signed)
Cardiology Office Note:    Date:  01/12/2019   ID:  Hannah Galloway, DOB 28-Apr-1965, MRN [id number]  PCP:  Eulas Post, MD  Cardiologist:  Kirk Ruths, MD  Electrophysiologist:  None   Referring MD: Eulas Post, MD   No chief complaint on file. F/U MRI  History of Present Illness:    Hannah Galloway is a 53 y.o. female with a hx of thoracic aortic aneurysm found initially in 2019 when she presented with chest pain.  CTA showed prominent ascending aorta.  Echo Jan 2020 showed normal LVF- aorta was poorly visualized.  Coronary CT scoring was done secondary to her history of chest pain and her family history of CAD.  Ca++ score was 0.    She had a f/u MRA 01-16-19 that showed no change in her ascending thoracic aortic dilatation at 3.7 cm.  She is in the office today for follow up.  She has done well since we saw her last.  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   She exercises 3-4 days a week.  Her B/P is well controlled.    Past Medical History:  Diagnosis Date  . Ascending aortic aneurysm ([place])   . Glaucoma     Past Surgical History:  Procedure Laterality Date  . BACK SURGERY    . TONSILLECTOMY AND ADENOIDECTOMY  1974    Current Medications: Current Meds  Medication Sig  . mometasone (ELOCON) 0.1 % lotion Apply topically daily.     Allergies:   Quinolones   Social History   Socioeconomic History  . Marital status: Married    Spouse name: Not on file  . Number of children: 2  . Years of education: Not on file  . Highest education level: Not on file  Occupational History    Comment: Lab   Social Needs  . Financial resource strain: Not on file  . Food insecurity    Worry: Not on file    Inability: Not on file  . Transportation needs    Medical: Not on file    Non-medical: Not on file  Tobacco Use  . Smoking status: Never Smoker  . Smokeless tobacco: Never Used  Substance and Sexual Activity  . Alcohol use: No  . Drug use: Never  . Sexual  activity: Not on file  Lifestyle  . Physical activity    Days per week: Not on file    Minutes per session: Not on file  . Stress: Not on file  Relationships  . Social Herbalist on phone: Not on file    Gets together: Not on file    Attends religious service: Not on file    Active member of club or organization: Not on file    Attends meetings of clubs or organizations: Not on file    Relationship status: Not on file  Other Topics Concern  . Not on file  Social History Narrative  . Not on file     Family History: The patient's family history includes Cancer (age of onset: 86) in her brother; Diabetes in her father; Heart attack in her father; Heart disease in her father; Hypertension in her mother; Lymphoma (age of onset: 90) in her brother; Stroke in her father.  ROS:   Please see the history of present illness.     All other systems reviewed and are negative.  EKGs/Labs/Other Studies Reviewed:    The following studies were reviewed today: MRA Jan 16, 2019  Recent Labs: 12/13/2018: ALT 15  Recent Lipid Panel    Component Value Date/Time   CHOL 220 (H) 12/13/2018 0000   TRIG 54 12/13/2018 0000   HDL 81 12/13/2018 0000   LDLCALC 130 (H) 12/13/2018 0000    Physical Exam:    VS:  BP 90/68   Pulse 83   Temp 98.1 F (36.7 C) (Temporal)   Ht 5\' 6"  (1.676 m)   Wt 149 lb (67.6 kg)   SpO2 98%   BMI 24.05 kg/m     Wt Readings from Last 3 Encounters:  01/12/19 149 lb (67.6 kg)  12/10/18 149 lb (67.6 kg)  04/26/18 148 lb (67.1 kg)     GEN:  Well nourished, well developed in no acute distress HEENT: Normal NECK: No JVD;  CARDIAC: RRR, no murmurs, rubs, gallops RESPIRATORY:  Clear to auscultation without rales, wheezing or rhonchi  MUSCULOSKELETAL:  No edema; No deformity  SKIN: Warm and dry NEUROLOGIC:  Alert and oriented x 3 PSYCHIATRIC:  Normal affect   ASSESSMENT:    Thoracic aortic aneurysm (HCC) 3.7 cm- no change on Nov 2020 MRA  PLAN:     Will discuss follow up with Dr Stanford Breed, pt would like it to be pushed out to 2 years if Dr Stanford Breed feels this is appropriate.    Medication Adjustments/Labs and Tests Ordered: Current medicines are reviewed at length with the patient today.  Concerns regarding medicines are outlined above.  No orders of the defined types were placed in this encounter.  No orders of the defined types were placed in this encounter.   Patient Instructions  Medication Instructions:  Your physician recommends that you continue on your current medications as directed. Please refer to the Current Medication list given to you today. *If you need a refill on your cardiac medications before your next appointment, please call your pharmacy*  Lab Work: None  If you have labs (blood work) drawn today and your tests are completely normal, you will receive your results only by: Marland Kitchen MyChart Message (if you have MyChart) OR . A paper copy in the mail If you have any lab test that is abnormal or we need to change your treatment, we will call you to review the results.  Testing/Procedures: None   Follow-Up: At [facility], you and your health needs are our priority.  As part of our continuing mission to provide you with exceptional heart care, we have created designated Provider Care Teams.  These Care Teams include your primary Cardiologist (physician) and Advanced Practice Providers (APPs -  Physician Assistants and Nurse Practitioners) who all work together to provide you with the care you need, when you need it.  Your next appointment:   24 month(s)  The format for your next appointment:   In Person  Provider:   Kirk Ruths, MD  Other Instructions      Signed, Kerin Ransom, PA-C  01/12/2019 3:58 PM    [place]

## 2019-01-17 ENCOUNTER — Telehealth: Payer: Self-pay

## 2019-01-17 NOTE — Telephone Encounter (Signed)
Patient notified and voiced understanding.

## 2019-01-17 NOTE — Telephone Encounter (Signed)
-----   Message from Erlene Quan, [place] sent at 01/12/2019  4:39 PM EST -----   ----- Message ----- From: Lelon Perla, MD Sent: 01/12/2019   4:22 PM EST To: Erlene Quan, PA-C    ----- Message ----- From: Ilean China Sent: 01/12/2019   4:03 PM EST To: Lelon Perla, MD  She wants to know if she can follow up in two years instead of one year if you think that's OK,  and will she need another MRA before her next follow up.

## 2019-02-08 ENCOUNTER — Other Ambulatory Visit: Payer: Self-pay | Admitting: Obstetrics and Gynecology

## 2019-02-08 DIAGNOSIS — Z6822 Body mass index (BMI) 22.0-22.9, adult: Secondary | ICD-10-CM | POA: Diagnosis not present

## 2019-02-08 DIAGNOSIS — Z1231 Encounter for screening mammogram for malignant neoplasm of breast: Secondary | ICD-10-CM | POA: Diagnosis not present

## 2019-02-08 DIAGNOSIS — Z01419 Encounter for gynecological examination (general) (routine) without abnormal findings: Secondary | ICD-10-CM | POA: Diagnosis not present

## 2019-02-08 DIAGNOSIS — Z1151 Encounter for screening for human papillomavirus (HPV): Secondary | ICD-10-CM | POA: Diagnosis not present

## 2019-02-08 LAB — HM MAMMOGRAPHY

## 2019-02-17 DIAGNOSIS — G5601 Carpal tunnel syndrome, right upper limb: Secondary | ICD-10-CM | POA: Diagnosis not present

## 2019-02-17 DIAGNOSIS — M79641 Pain in right hand: Secondary | ICD-10-CM | POA: Diagnosis not present

## 2019-03-14 ENCOUNTER — Encounter: Payer: Self-pay | Admitting: Cardiothoracic Surgery

## 2019-03-17 ENCOUNTER — Ambulatory Visit: Payer: 59 | Admitting: Cardiothoracic Surgery

## 2019-03-18 ENCOUNTER — Ambulatory Visit: Payer: 59 | Admitting: Cardiothoracic Surgery

## 2019-03-31 DIAGNOSIS — M9901 Segmental and somatic dysfunction of cervical region: Secondary | ICD-10-CM | POA: Diagnosis not present

## 2019-04-07 DIAGNOSIS — M9901 Segmental and somatic dysfunction of cervical region: Secondary | ICD-10-CM | POA: Diagnosis not present

## 2019-06-02 DIAGNOSIS — H5213 Myopia, bilateral: Secondary | ICD-10-CM | POA: Diagnosis not present

## 2019-06-28 DIAGNOSIS — M9901 Segmental and somatic dysfunction of cervical region: Secondary | ICD-10-CM | POA: Diagnosis not present

## 2019-07-05 ENCOUNTER — Other Ambulatory Visit: Payer: Self-pay

## 2019-07-05 ENCOUNTER — Ambulatory Visit (INDEPENDENT_AMBULATORY_CARE_PROVIDER_SITE_OTHER): Payer: 59 | Admitting: Podiatry

## 2019-07-05 ENCOUNTER — Ambulatory Visit (INDEPENDENT_AMBULATORY_CARE_PROVIDER_SITE_OTHER): Payer: 59

## 2019-07-05 DIAGNOSIS — M722 Plantar fascial fibromatosis: Secondary | ICD-10-CM

## 2019-07-05 DIAGNOSIS — G8929 Other chronic pain: Secondary | ICD-10-CM

## 2019-07-05 DIAGNOSIS — M79672 Pain in left foot: Secondary | ICD-10-CM

## 2019-07-05 MED ORDER — MELOXICAM 15 MG PO TABS: 15.0000 mg | ORAL_TABLET | Freq: Every day | ORAL | 0 refills | Status: DC

## 2019-07-05 NOTE — Patient Instructions (Signed)

## 2019-07-07 ENCOUNTER — Other Ambulatory Visit: Payer: Self-pay | Admitting: Podiatry

## 2019-07-07 DIAGNOSIS — M722 Plantar fascial fibromatosis: Secondary | ICD-10-CM

## 2019-07-10 NOTE — Progress Notes (Signed)
Subjective:   Patient ID: Hannah Galloway, female   DOB: 54 y.o.   MRN: [id number]   HPI 54 year old female presents the office today for concerns of pain to the left plantar heel as well as plantar midfoot which stopped last 3 months.  She has a history of plantar fasciitis in the right foot that we treated.  This started 3 months ago without any injury.  She has pain in the morning when she first gets up after sitting.  No swelling or redness.  She had no recent treatment.  She has no other concerns today.  No weakness or falls.   Review of Systems  All other systems reviewed and are negative.  Past Medical History:  Diagnosis Date  . Ascending aortic aneurysm ([place])   . Glaucoma     Past Surgical History:  Procedure Laterality Date  . BACK SURGERY    . TONSILLECTOMY AND ADENOIDECTOMY  1974     Current Outpatient Medications:  .  meloxicam (MOBIC) 15 MG tablet, Take 1 tablet (15 mg total) by mouth daily., Disp: 30 tablet, Rfl: 0 .  mometasone (ELOCON) 0.1 % lotion, Apply topically daily. (Patient not taking: Reported on 07/05/2019), Disp: 60 mL, Rfl: 1  Allergies  Allergen Reactions  . Quinolones     Patient was warned about not using Cipro and similar antibiotics. Recent studies have raised concern that fluoroquinolone antibiotics could be associated with an increased risk of aortic aneurysm Fluoroquinolones have non-antimicrobial properties that might jeopardise the integrity of the extracellular matrix of the vascular wall In a  propensity score matched cohort study in [place], there was a 66% increased rate of aortic aneurysm or dissection associated with oral fluoroquinolone use, compared wit        Objective:  Physical Exam  General: AAO x3, NAD  Dermatological: Skin is warm, dry and supple bilateral. Nails x 10 are well manicured; remaining integument appears unremarkable at this time. There are no open sores, no preulcerative lesions, no rash or signs of infection  present.  Vascular: Dorsalis Pedis artery and Posterior Tibial artery pedal pulses are 2/4 bilateral with immedate capillary fill time. Pedal hair growth present. No varicosities and no lower extremity edema present bilateral. There is no pain with calf compression, swelling, warmth, erythema.   Neruologic: Grossly intact via light touch bilateral.  Negative tinel sign.   Musculoskeletal: Tenderness to palpation along the plantar medial tubercle of the calcaneus at the insertion of plantar fascia on the left foot. There is mild pain along the course of the plantar fascia within the arch of the foot. Plantar fascia appears to be intact. There is no pain with lateral compression of the calcaneus or pain with vibratory sensation. There is no pain along the course or insertion of the achilles tendon. No other areas of tenderness to bilateral lower extremities. Muscular strength 5/5 in all groups tested bilateral.  Gait: Unassisted, Nonantalgic.       Assessment:   Left foot plantar fasciitis     Plan:  -Treatment options discussed including all alternatives, risks, and complications -Etiology of symptoms were discussed -X-rays were obtained and reviewed with the patient. There is no evidence of acute fracture or stress fracture -Plantar fascial brace dispensed -Night splint -Prescribed mobic. Discussed side effects of the medication and directed to stop if any are to occur and call the office.  -Stretching/icing -Shoe modifications/orthotics  Return in about 4 weeks (around 08/02/2019) for plantar fasciitis/heel pain .  Hannah Galloway  DPM

## 2019-08-02 ENCOUNTER — Ambulatory Visit: Payer: 59 | Admitting: Podiatry

## 2019-08-17 ENCOUNTER — Other Ambulatory Visit: Payer: Self-pay | Admitting: Podiatry

## 2019-08-17 ENCOUNTER — Telehealth: Payer: Self-pay | Admitting: Podiatry

## 2019-08-17 MED ORDER — MELOXICAM 15 MG PO TABS: 15.0000 mg | ORAL_TABLET | Freq: Every day | ORAL | 0 refills | Status: DC

## 2019-08-17 NOTE — Telephone Encounter (Signed)
Sent please let her know

## 2019-08-17 NOTE — Telephone Encounter (Signed)
Pt would like a refill on her Meloxicam for the next month. She states its helping a little. She is schedule to come in at the end of july

## 2019-08-18 MED FILL — MELOXICAM 15 MG TABLET: 15 | 30 days supply | Qty: 30 | Fill #0

## 2019-08-23 ENCOUNTER — Ambulatory Visit: Payer: 59 | Admitting: Podiatry

## 2019-09-20 ENCOUNTER — Encounter: Payer: Self-pay | Admitting: Podiatry

## 2019-09-20 ENCOUNTER — Ambulatory Visit (INDEPENDENT_AMBULATORY_CARE_PROVIDER_SITE_OTHER): Payer: 59 | Admitting: Podiatry

## 2019-09-20 ENCOUNTER — Other Ambulatory Visit: Payer: Self-pay

## 2019-09-20 DIAGNOSIS — M722 Plantar fascial fibromatosis: Secondary | ICD-10-CM

## 2019-09-20 DIAGNOSIS — G8929 Other chronic pain: Secondary | ICD-10-CM

## 2019-09-20 DIAGNOSIS — M79672 Pain in left foot: Secondary | ICD-10-CM

## 2019-09-20 NOTE — Patient Instructions (Signed)

## 2019-09-20 NOTE — Progress Notes (Signed)
Subjective: 54 year old female presents the office today for follow evaluation of left heel pain.  At last appointment she was doing well however over the last couple weeks the pain is really come back.  Denies any recent injury or trauma or any changes in activity.  She is previous had injection which was done well.  She is still been doing the night splint.  She states the meloxicam causes her not to sleep well. Denies any systemic complaints such as fevers, chills, nausea, vomiting. No acute changes since last appointment, and no other complaints at this time.   Objective: AAO x3, NAD DP/PT pulses palpable bilaterally, CRT less than 3 seconds There is continuation of tenderness to the plantar medial tubercle of the calcaneus with insertion of plantar fascial.  Plantar fascial appears to be intact.  There is no pain with lateral compression of calcaneus.  No pain with Achilles tendon.  No edema, erythema.  Negative Tinel sign. No pain with calf compression, swelling, warmth, erythema  Assessment: Left heel pain, plantar fasciitis  Plan: -All treatment options discussed with the patient including all alternatives, risks, complications.  -Second steroid was performed for the left heel.  See procedure note below. -Plantar fascial strapping applied -Monitor for dress orthotics.  She has power step inserts in her orthotic shoes which do well but seems to be her track shoes during the day which are causing discomfort. -Continue stretching, icing daily. -Patient encouraged to call the office with any questions, concerns, change in symptoms.  Procedure: Injection Tendon/Ligament Discussed alternatives, risks, complications and verbal consent was obtained.  Location: LEFT plantar fascia at the glabrous junction; medial approach. Skin Prep: Alcohol  Injectate: 0.5cc 0.5% marcaine plain, 0.5 cc 2% lidocaine plain and, 1 cc kenalog 10. Disposition: Patient tolerated procedure well. Injection site  dressed with a band-aid.  Post-injection care was discussed and return precautions discussed.   Return in about 4 weeks (around 10/18/2019).  [name] DPM

## 2019-10-11 ENCOUNTER — Ambulatory Visit: Payer: 59 | Admitting: Orthotics

## 2019-10-11 ENCOUNTER — Other Ambulatory Visit: Payer: Self-pay

## 2019-10-11 DIAGNOSIS — M722 Plantar fascial fibromatosis: Secondary | ICD-10-CM

## 2019-10-11 DIAGNOSIS — M79672 Pain in left foot: Secondary | ICD-10-CM

## 2019-10-11 DIAGNOSIS — G8929 Other chronic pain: Secondary | ICD-10-CM

## 2019-10-11 NOTE — Progress Notes (Signed)
Patient came in today to pick up custom made foot orthotics.  The goals were accomplished and the patient reported no dissatisfaction with said orthotics.  Patient was advised of breakin period and how to report any issues. 

## 2019-11-02 ENCOUNTER — Encounter: Payer: Self-pay | Admitting: Family Medicine

## 2019-11-07 DIAGNOSIS — Z86010 Personal history of colonic polyps: Secondary | ICD-10-CM | POA: Diagnosis not present

## 2019-11-07 DIAGNOSIS — D509 Iron deficiency anemia, unspecified: Secondary | ICD-10-CM | POA: Diagnosis not present

## 2019-11-30 DIAGNOSIS — D509 Iron deficiency anemia, unspecified: Secondary | ICD-10-CM | POA: Diagnosis not present

## 2019-12-01 DIAGNOSIS — D509 Iron deficiency anemia, unspecified: Secondary | ICD-10-CM | POA: Diagnosis not present

## 2019-12-06 ENCOUNTER — Ambulatory Visit: Payer: 59 | Attending: Internal Medicine

## 2019-12-06 ENCOUNTER — Other Ambulatory Visit (HOSPITAL_BASED_OUTPATIENT_CLINIC_OR_DEPARTMENT_OTHER): Payer: Self-pay | Admitting: Internal Medicine

## 2019-12-06 DIAGNOSIS — Z23 Encounter for immunization: Secondary | ICD-10-CM

## 2019-12-06 NOTE — Progress Notes (Signed)
   Covid-19 Vaccination Clinic  Name:  Hannah Galloway    MRN: [id number] DOB: [birth year]  12/06/2019  Hannah Galloway was observed post Covid-19 immunization for 15 minutes without incident. She was provided with Vaccine Information Sheet and instruction to access the V-Safe system. Vaccinated by Kristeen Miss.  Hannah Galloway was instructed to call 911 with any severe reactions post vaccine: Marland Kitchen Difficulty breathing  . Swelling of face and throat  . A fast heartbeat  . A bad rash all over body  . Dizziness and weakness

## 2019-12-14 MED FILL — PFIZER-BIONTECH COVID-19 VA: 30 | 1 days supply | Qty: 0 | Fill #0

## 2019-12-27 ENCOUNTER — Encounter: Payer: Self-pay | Admitting: *Deleted

## 2019-12-27 ENCOUNTER — Other Ambulatory Visit: Payer: Self-pay | Admitting: *Deleted

## 2019-12-27 DIAGNOSIS — I712 Thoracic aortic aneurysm, without rupture, unspecified: Secondary | ICD-10-CM

## 2020-01-16 ENCOUNTER — Other Ambulatory Visit: Payer: Self-pay

## 2020-01-16 ENCOUNTER — Ambulatory Visit
Admission: RE | Admit: 2020-01-16 | Discharge: 2020-01-16 | Disposition: A | Discharge status: Home / Self Care | Payer: 59 | Source: Ambulatory Visit | Attending: Cardiology | Admitting: Cardiology

## 2020-01-16 DIAGNOSIS — I712 Thoracic aortic aneurysm, without rupture, unspecified: Secondary | ICD-10-CM

## 2020-01-16 MED ORDER — GADOBENATE DIMEGLUMINE 529 MG/ML IV SOLN
14.0000 mL | Freq: Once | INTRAVENOUS | Status: AC | PRN
  Administered 2020-01-16: 14 mL via INTRAVENOUS

## 2020-02-15 DIAGNOSIS — Z01411 Encounter for gynecological examination (general) (routine) with abnormal findings: Secondary | ICD-10-CM | POA: Diagnosis not present

## 2020-02-15 DIAGNOSIS — Z6824 Body mass index (BMI) 24.0-24.9, adult: Secondary | ICD-10-CM | POA: Diagnosis not present

## 2020-02-15 DIAGNOSIS — Z01419 Encounter for gynecological examination (general) (routine) without abnormal findings: Secondary | ICD-10-CM | POA: Diagnosis not present

## 2020-02-15 DIAGNOSIS — Z1231 Encounter for screening mammogram for malignant neoplasm of breast: Secondary | ICD-10-CM | POA: Diagnosis not present

## 2020-02-15 DIAGNOSIS — Z113 Encounter for screening for infections with a predominantly sexual mode of transmission: Secondary | ICD-10-CM | POA: Diagnosis not present

## 2020-02-15 DIAGNOSIS — Z124 Encounter for screening for malignant neoplasm of cervix: Secondary | ICD-10-CM | POA: Diagnosis not present

## 2020-02-15 DIAGNOSIS — R748 Abnormal levels of other serum enzymes: Secondary | ICD-10-CM

## 2020-07-02 ENCOUNTER — Other Ambulatory Visit (HOSPITAL_BASED_OUTPATIENT_CLINIC_OR_DEPARTMENT_OTHER): Payer: Self-pay

## 2020-07-02 MED ORDER — CARESTART COVID-19 HOME TEST VI KIT
PACK | 0 refills | Status: DC
  Filled 2020-07-02: qty 2, 4d supply, fill #0

## 2020-08-23 ENCOUNTER — Encounter: Payer: Self-pay | Admitting: Family Medicine

## 2020-08-23 DIAGNOSIS — I712 Thoracic aortic aneurysm, without rupture, unspecified: Secondary | ICD-10-CM

## 2020-08-24 ENCOUNTER — Telehealth: Payer: Self-pay | Admitting: Cardiology

## 2020-08-24 NOTE — Telephone Encounter (Signed)
New Message:    Pt would like to switch from Dr Stanford Breed to Dr Harrell Gave please. She works at [employer].and it would work out good for her. Is this alright with both of you?

## 2020-08-31 NOTE — Telephone Encounter (Signed)
Ok by me

## 2020-09-28 ENCOUNTER — Other Ambulatory Visit (HOSPITAL_BASED_OUTPATIENT_CLINIC_OR_DEPARTMENT_OTHER): Payer: Self-pay

## 2020-09-28 MED ORDER — CARESTART COVID-19 HOME TEST VI KIT
PACK | 0 refills | Status: DC
  Filled 2020-09-28: qty 4, 8d supply, fill #0

## 2020-10-03 ENCOUNTER — Other Ambulatory Visit: Payer: Self-pay

## 2020-10-03 ENCOUNTER — Ambulatory Visit (INDEPENDENT_AMBULATORY_CARE_PROVIDER_SITE_OTHER): Payer: No Typology Code available for payment source | Admitting: Cardiology

## 2020-10-03 ENCOUNTER — Encounter (HOSPITAL_BASED_OUTPATIENT_CLINIC_OR_DEPARTMENT_OTHER): Payer: Self-pay | Admitting: Cardiology

## 2020-10-03 VITALS — BP 122/80 | HR 75 | Ht 66.0 in | Wt 156.2 lb

## 2020-10-03 DIAGNOSIS — I712 Thoracic aortic aneurysm, without rupture, unspecified: Secondary | ICD-10-CM

## 2020-10-03 DIAGNOSIS — Z01812 Encounter for preprocedural laboratory examination: Secondary | ICD-10-CM

## 2020-10-03 DIAGNOSIS — Z01818 Encounter for other preprocedural examination: Secondary | ICD-10-CM

## 2020-10-03 DIAGNOSIS — Z7189 Other specified counseling: Secondary | ICD-10-CM | POA: Diagnosis not present

## 2020-10-03 NOTE — Progress Notes (Signed)
Cardiology Office Note:    Date:  10/03/2020   ID:  KIALEY Galloway, DOB 24-Aug-1965, MRN [id number]  PCP:  Eulas Post, MD  Cardiologist:  Buford Dresser, MD  Referring MD: Eulas Post, MD   CC: establish care/follow up TAA   History of Present Illness:    Hannah Galloway is a 55 y.o. female with a hx of ascending aortic aneurysm who is seen for follow-up. She was initially a patient of Dr. 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.   Cardiovascular risk factors: Prior clinical ASCVD: Ascending aortic aneurysm (dx 3 yrs ago) Comorbid conditions, including hypertension, hyperlipidemia, diabetes, chronic kidney disease: None Metabolic syndrome/Obesity: BMI 25.21 Chronic inflammatory conditions: None Tobacco use history: Never Family history: Told that her father, paternal grandfather and paternal uncle all died of a massive heart attack. However she does not know this for certain (ie we discussed if it might have been an aortic dissection, but there is no way to know for sure) Prior cardiac testing and/or incidental findings on other testing (ie coronary calcium): reviewed prior Cts and MRAs today. No calcium on imaging  Overall, she feels like she is doing well. Occasionally, she believes she can feel her heart beating. This occurs while sitting at work, and she does not experience pain or a faster heart rate. Discussed [employer] as an option for evaluating this.  About 3 years ago, she began having severe chest pains while getting ready for bed. She felt like she couldn't get a deep breath. The next morning the chest pain recurred, and she went to the ED. At the ED, no indication of MI was found. However, she was dx with the aortic aneurysm. At this time she is not experiencing chest pains or shortness of breath.  She denies any lightheadedness, headaches, syncope, orthopnea, or PND. Also has no lower extremity edema or  exertional symptoms.   Past Medical History:  Diagnosis Date   Ascending aortic aneurysm ([place])    Glaucoma     Past Surgical History:  Procedure Laterality Date   BACK SURGERY     TONSILLECTOMY AND ADENOIDECTOMY  1974    Current Medications: Current Outpatient Medications on File Prior to Visit  Medication Sig   COVID-19 mRNA vaccine, Pfizer, 30 MCG/0.3ML injection INJECT AS DIRECTED   No current facility-administered medications on file prior to visit.     Allergies:   Quinolones   Social History   Tobacco Use   Smoking status: Never   Smokeless tobacco: Never  Vaping Use   Vaping Use: Never used  Substance Use Topics   Alcohol use: No   Drug use: Never    Family History: family history includes Cancer (age of onset: 6) in her brother; Diabetes in her father; Heart attack in her father; Heart disease in her father; Hypertension in her mother; Lymphoma (age of onset: 34) in her brother; Stroke in her father.  ROS:   Please see the history of present illness.  Additional pertinent ROS: Constitutional: Negative for chills, fever, night sweats, unintentional weight loss  HENT: Negative for ear pain and hearing loss.   Eyes: Negative for loss of vision and eye pain.  Respiratory: Negative for cough, sputum, wheezing.   Cardiovascular: See HPI. Gastrointestinal: Negative for abdominal pain, melena, and hematochezia.  Genitourinary: Negative for dysuria and hematuria.  Musculoskeletal: Negative for falls and myalgias.  Skin: Negative for itching and rash.  Neurological: Negative for  focal weakness, focal sensory changes and loss of consciousness.  Endo/Heme/Allergies: Does not bruise/bleed easily.     EKGs/Labs/Other Studies Reviewed:    The following studies were reviewed today:  MRA 01/17/20: Aorta: Similar appearing mobile fusiform ectasia of the ascending aorta measuring up to 37 mm. Widely patent. Conventional branching pattern of the aortic arch without  evidence of significant stenosis.  MRA 01/07/19: Aorta: Similar mild fusiform aneurysmal dilatation of the ascending thoracic aorta, maximal diameter 3.7 cm. No significant interval change. No interval dissection. Major branch vessels remain patent. Descending thoracic aorta is normal in caliber measuring 2 cm in diameter.  CT calcium score 04/30/18: 1. Coronary calcium score of 0. This was 0 percentile for age and sex matched control.   2. Upper normal size of the ascending aorta measuring 40 mm in the axial views. A dedicated chest CTA or MRA is recommended.  CT PE 12/29/2017: Cardiovascular: Pulmonary arterial opacification is good. There are no pulmonary emboli. No aortic calcification is seen. The ascending aorta is slightly prominent at 3.7 cm. Heart size is normal. No coronary artery calcification is visible.  TTE 03/12/2018: - Left ventricle: The cavity size was normal. Systolic function was    normal. The estimated ejection fraction was in the range of 55%    to 60%. Wall motion was normal; there were no regional wall    motion abnormalities. Left ventricular diastolic function    parameters were normal.  - Atrial septum: No defect or patent foramen ovale was identified.   EKG:  EKG is personally reviewed.   10/03/2020: NSR at 75 bpm  Recent Labs: No results found for requested labs within last 8760 hours.  Recent Lipid Panel    Component Value Date/Time   CHOL 220 (H) 12/13/2018 0000   TRIG 54 12/13/2018 0000   HDL 81 12/13/2018 0000   LDLCALC 130 (H) 12/13/2018 0000    Physical Exam:    VS:  BP 122/80   Pulse 75   Ht '5\' 6"'$  (1.676 m)   Wt 156 lb 3.2 oz (70.9 kg)   BMI 25.21 kg/m     Wt Readings from Last 3 Encounters:  10/03/20 156 lb 3.2 oz (70.9 kg)  01/12/19 149 lb (67.6 kg)  12/10/18 149 lb (67.6 kg)    GEN: Well nourished, well developed in no acute distress HEENT: Normal, moist mucous membranes NECK: No JVD CARDIAC: regular rhythm, normal S1 and  S2, no rubs or gallops. No murmur. VASCULAR: Radial and DP pulses 2+ bilaterally. No carotid bruits RESPIRATORY:  Clear to auscultation without rales, wheezing or rhonchi  ABDOMEN: Soft, non-tender, non-distended MUSCULOSKELETAL:  Ambulates independently SKIN: Warm and dry, no edema NEUROLOGIC:  Alert and oriented x 3. No focal neuro deficits noted. PSYCHIATRIC:  Normal affect    ASSESSMENT:    1. Thoracic aortic aneurysm without rupture ([place])   2. Pre-procedure lab exam   3. Cardiac risk counseling   4. Counseling on health promotion and disease prevention    PLAN:    Thoracic aortic aneurysm: -has been stable at 3.7 cm when measured in 2019, 2020, 2021 -no known family history of aortic disease (though has been told family members died of MI, cannot exclude potential other etiology) -no known history of connective tissue diseases -has tricuspid aortic valve -fluoroquinolones already on adverse medication list -given stability of size/shape, after shared decision making we will monitor every two years as long as it remains stable.  -blood pressure at goal -lipids borderline in  2020, but no coronary calcium and low ASCVD risk -recheck MRA 12/2021, BMET prior, follow up with me after  Cardiac risk counseling and prevention recommendations: -recommend heart healthy/Mediterranean diet, with whole grains, fruits, vegetable, fish, lean meats, nuts, and olive oil. Limit salt. -recommend moderate walking, 3-5 times/week for 30-50 minutes each session. Aim for at least 150 minutes.week. Goal should be pace of 3 miles/hours, or walking 1.5 miles in 30 minutes -recommend avoidance of tobacco products. Avoid excess alcohol. -ASCVD risk score: The 10-year ASCVD risk score Mikey Bussing DC Brooke Bonito., et al., 2013) is: 1%   Values used to calculate the score:     Age: 71 years     Sex: Female     Is Non-Hispanic African American: No     Diabetic: No     Tobacco smoker: No     Systolic Blood Pressure:  122 mmHg     Is BP treated: No     HDL Cholesterol: 81 mg/dL     Total Cholesterol: 220 mg/dL    Plan for follow up: Following MRA in November 2023, or sooner as needed.  Buford Dresser, MD, PhD, [facility]    Medication Adjustments/Labs and Tests Ordered: Current medicines are reviewed at length with the patient today.  Concerns regarding medicines are outlined above.   Orders Placed This Encounter  Procedures   MR Angiogram Chest W Wo Contrast   Basic metabolic panel   EKG [id number]    No orders of the defined types were placed in this encounter.  Patient Instructions  Medication Instructions:  Your Physician recommend you continue on your current medication as directed.    *If you need a refill on your cardiac medications before your next appointment, please call your pharmacy*   Lab Work: Your physician recommends that you return for lab work 1 week prior to procedure.   If you have labs (blood work) drawn today and your tests are completely normal, you will receive your results only by: [place] (if you have MyChart) OR A paper copy in the mail If you have any lab test that is abnormal or we need to change your treatment, we will call you to review the results.   Testing/Procedures: MR Angiogram Chest @ [facility] in November, 2023     Follow-Up: At [facility], you and your health needs are our priority.  As part of our continuing mission to provide you with exceptional heart care, we have created designated Provider Care Teams.  These Care Teams include your primary Cardiologist (physician) and Advanced Practice Providers (APPs -  Physician Assistants and Nurse Practitioners) who all work together to provide you with the care you need, when you need it.  We recommend signing up for the patient portal called "MyChart".  Sign up information is provided on this After Visit Summary.  MyChart is used to connect with patients for  Virtual Visits (Telemedicine).  Patients are able to view lab/test results, encounter notes, upcoming appointments, etc.  Non-urgent messages can be sent to your provider as well.   To learn more about what you can do with MyChart, go to [email or web address].    Your next appointment:   1 year(s)  The format for your next appointment:   In Person  Provider:   Buford Dresser, MD      [facility],acting as a scribe for Buford Dresser, MD.,have documented all relevant documentation on the behalf of Buford Dresser, MD,as directed by  Buford Dresser, MD  while in the presence of Buford Dresser, MD.  I, Buford Dresser, MD, have reviewed all documentation for this visit. The documentation on 10/03/20 for the exam, diagnosis, procedures, and orders are all accurate and complete.   Signed, Buford Dresser, MD PhD 10/03/2020 6:31 PM    [place]

## 2020-10-03 NOTE — Patient Instructions (Signed)
Medication Instructions:  Your Physician recommend you continue on your current medication as directed.    *If you need a refill on your cardiac medications before your next appointment, please call your pharmacy*   Lab Work: Your physician recommends that you return for lab work 1 week prior to procedure.   If you have labs (blood work) drawn today and your tests are completely normal, you will receive your results only by: [place] (if you have MyChart) OR A paper copy in the mail If you have any lab test that is abnormal or we need to change your treatment, we will call you to review the results.   Testing/Procedures: MR Angiogram Chest @ [facility] in November, 2023     Follow-Up: At [facility], you and your health needs are our priority.  As part of our continuing mission to provide you with exceptional heart care, we have created designated Provider Care Teams.  These Care Teams include your primary Cardiologist (physician) and Advanced Practice Providers (APPs -  Physician Assistants and Nurse Practitioners) who all work together to provide you with the care you need, when you need it.  We recommend signing up for the patient portal called "MyChart".  Sign up information is provided on this After Visit Summary.  MyChart is used to connect with patients for Virtual Visits (Telemedicine).  Patients are able to view lab/test results, encounter notes, upcoming appointments, etc.  Non-urgent messages can be sent to your provider as well.   To learn more about what you can do with MyChart, go to [email or web address].    Your next appointment:   1 year(s)  The format for your next appointment:   In Person  Provider:   Buford Dresser, MD

## 2020-10-15 IMAGING — CR DG CHEST 2V
2 series · 2 of 2 positions shown · non-contrast
Comparison: 08/25/2007

CLINICAL DATA: Chest pain.

EXAM:
CHEST - 2 VIEW

[w chest pa]
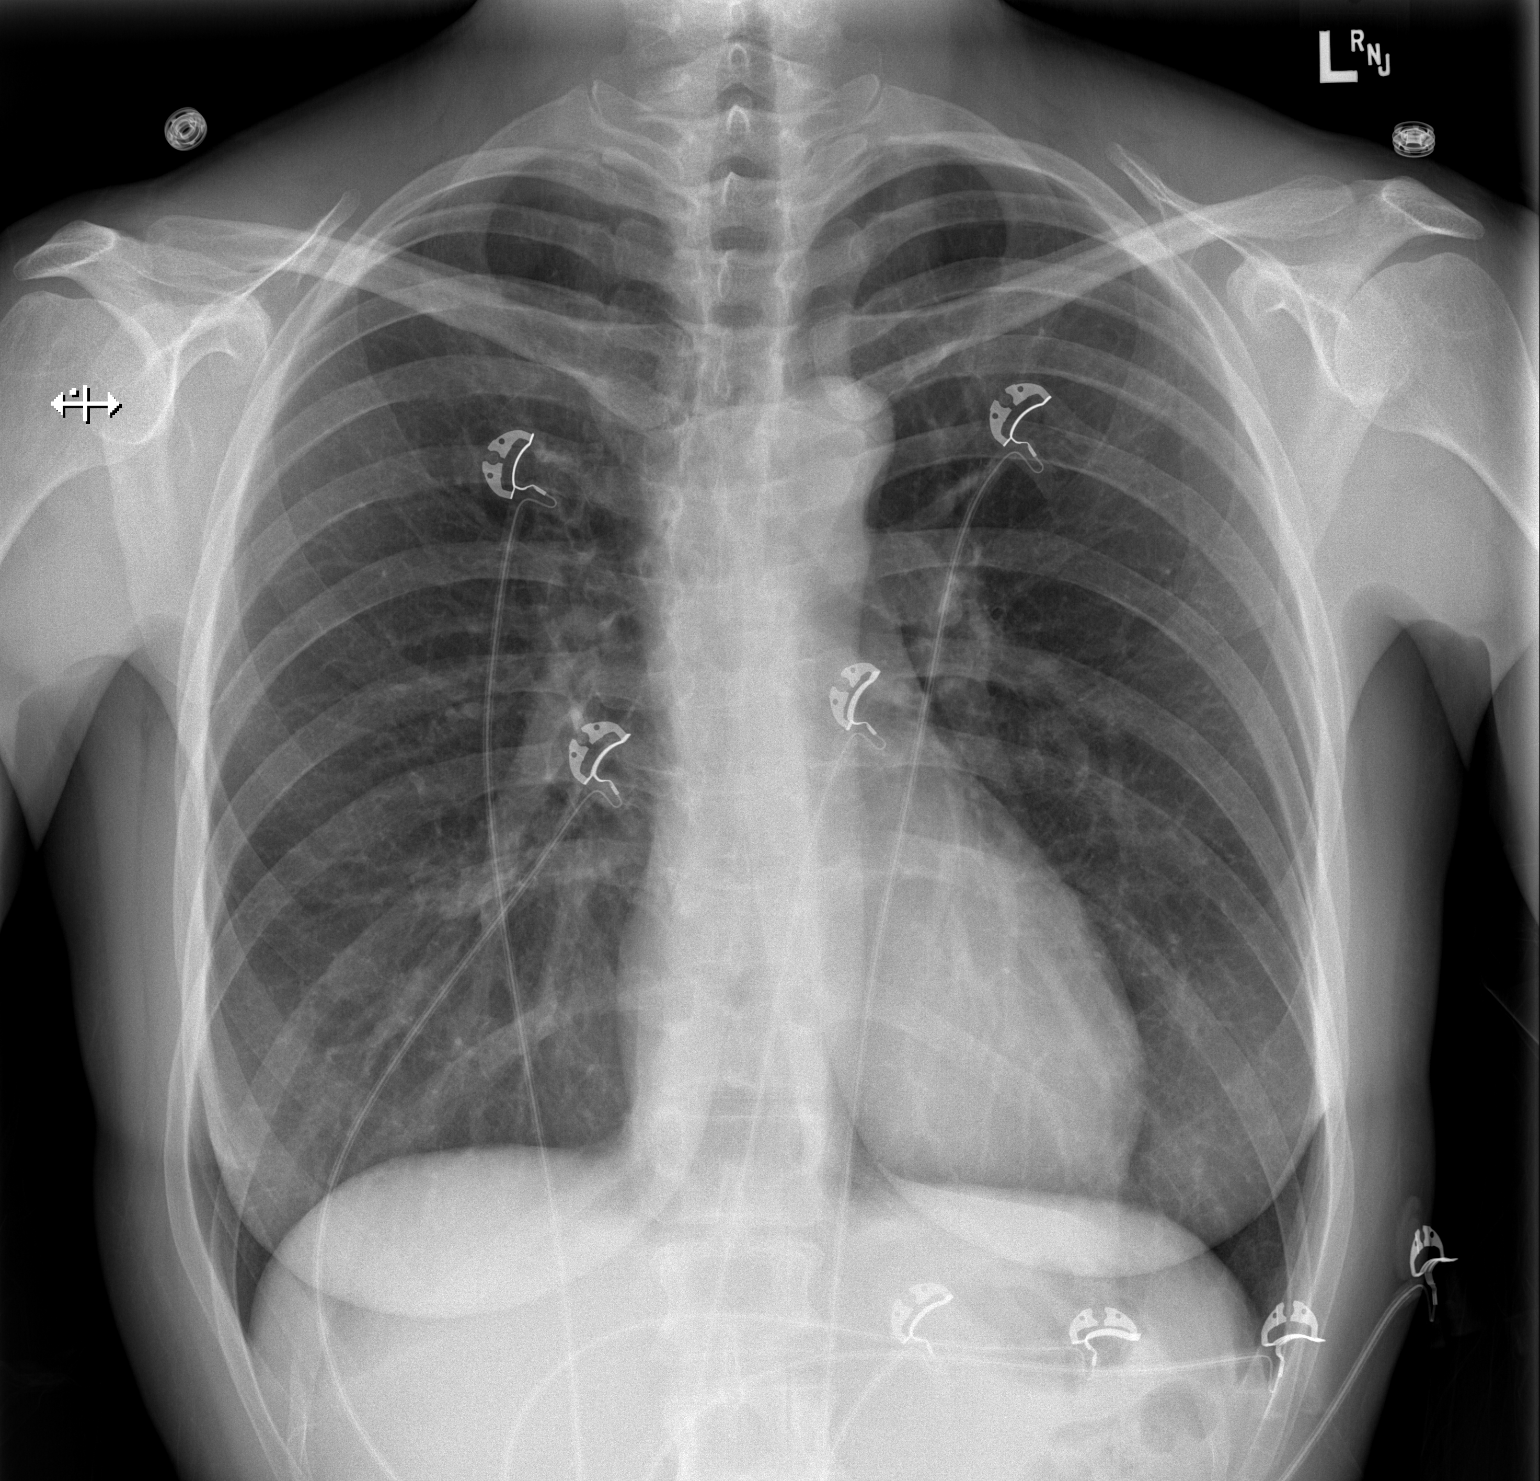

[w chest lat]
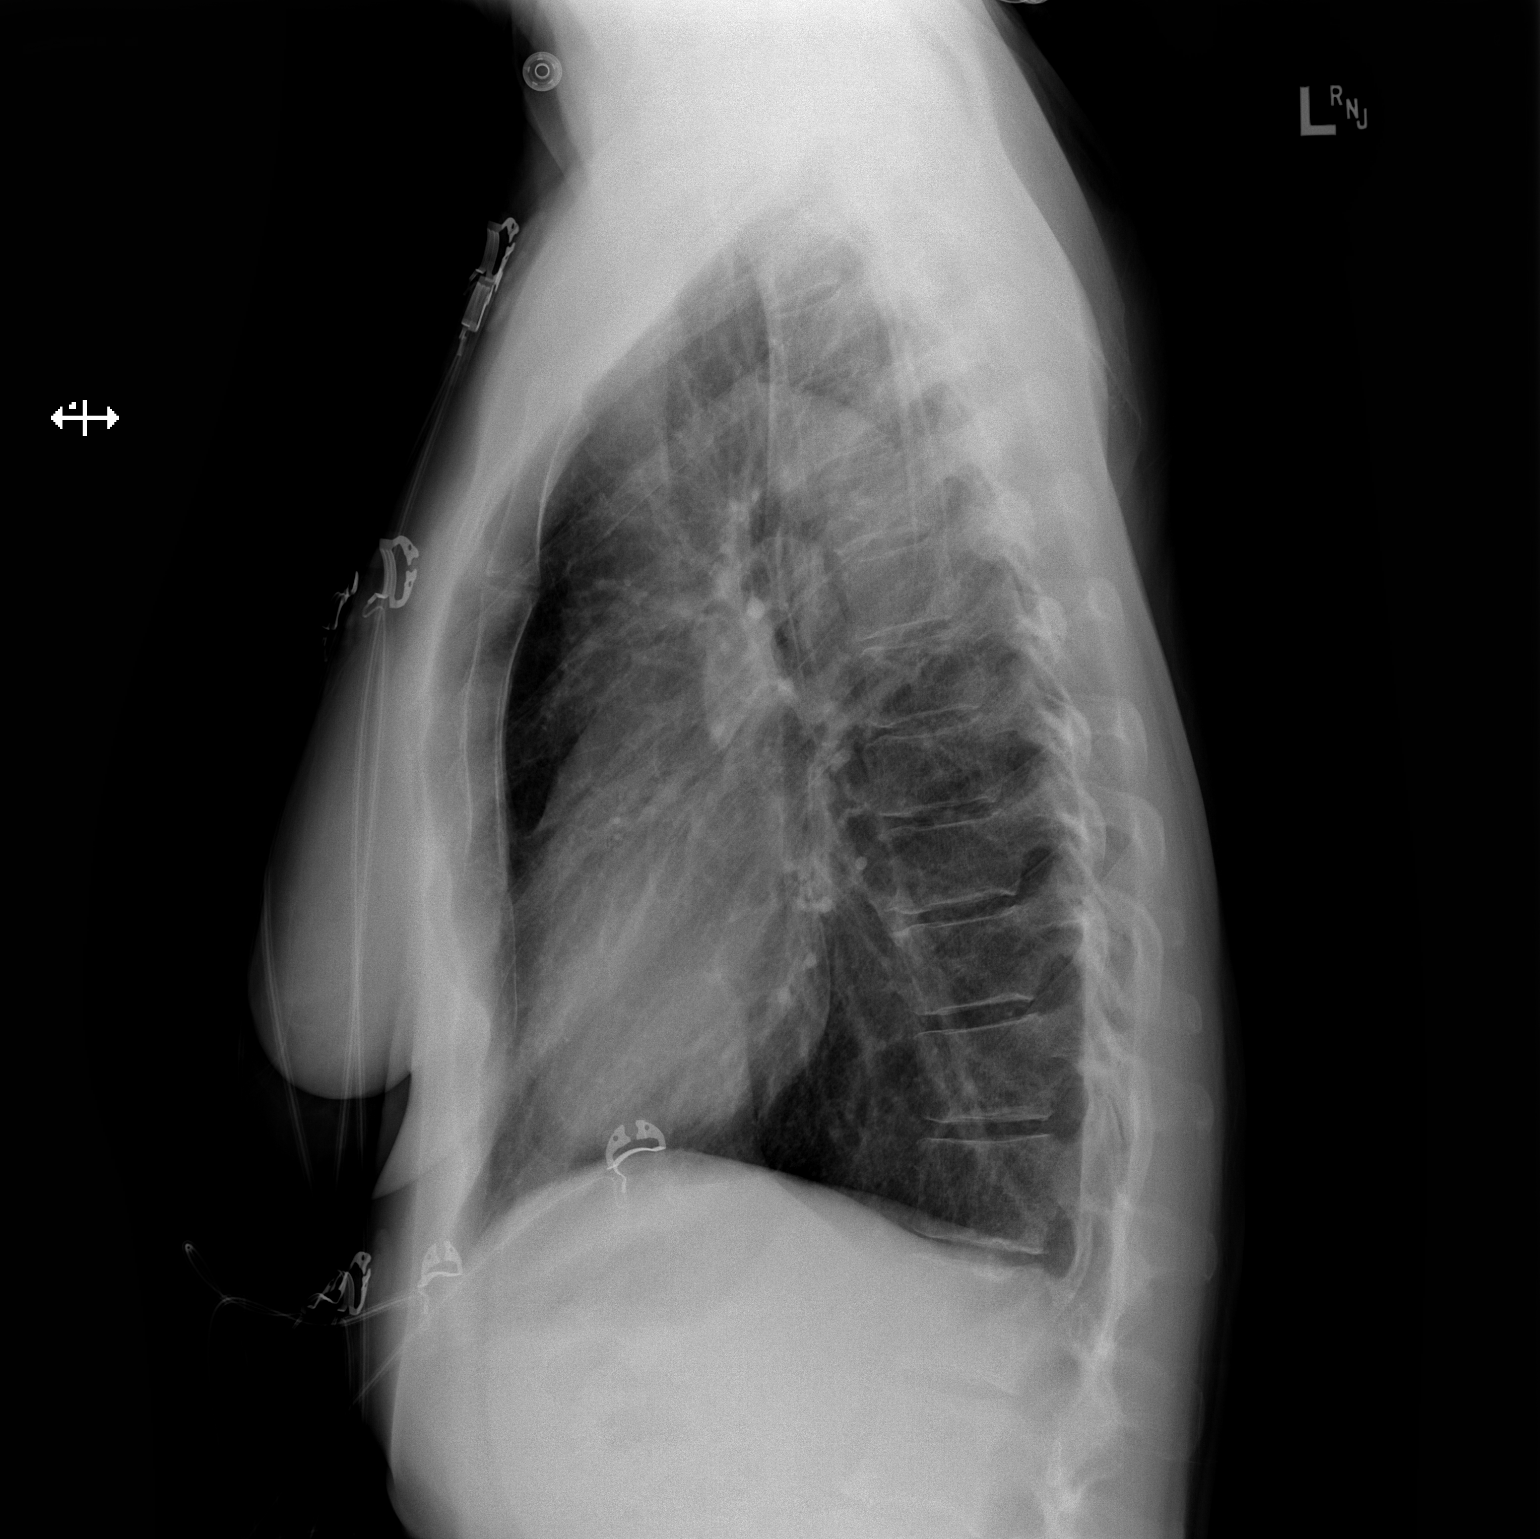

[2 of 2 positions shown; findings below may reference images not displayed]

FINDINGS: The heart size and mediastinal contours are within normal limits.
Both lungs are clear. The visualized skeletal structures are
unremarkable.
IMPRESSION: Normal exam.

## 2021-02-14 IMAGING — CT CT HEART SCORING
2 series · 14 of 20 positions shown, 16 images · non-contrast
Comparison: 12/29/2017
COMPARISON: 12/29/2017
COMPARISON: 12/29/2017

Addendum:
EXAM:
OVER-READ INTERPRETATION  CT CHEST

The following report is an over-read performed by radiologist Dr.
Flaquita Utley [REDACTED] on 04/30/2018. This over-read
does not include interpretation of cardiac or coronary anatomy or
pathology. The coronary calcium score interpretation by the
cardiologist is attached.
CLINICAL DATA: Risk stratification
Coronary Calcium Score
TECHNIQUE: The patient was scanned on a Siemens Force scanner. Axial
non-contrast 3 mm slices were carried out through the heart. The
data set was analyzed on a dedicated work station and scored using
the Agatson method.

[Series 3: casc 3.0 i36f 2 bestdiast 69 % · axial · 0.31mm/px · z∈[-229,-103]mm · 8 of 54 slices shown, 10 images]
[im 6/54  vessel]
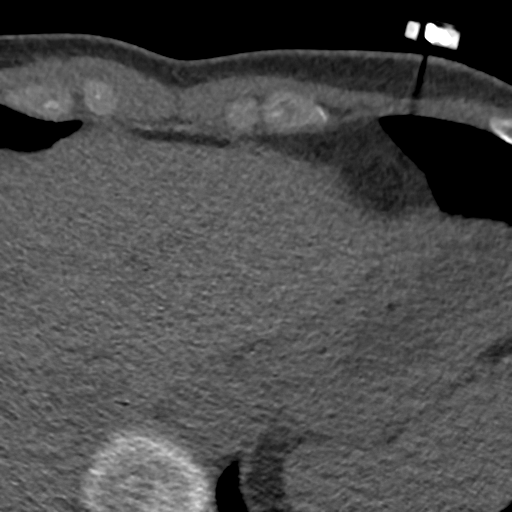
[im 6/54  lung]
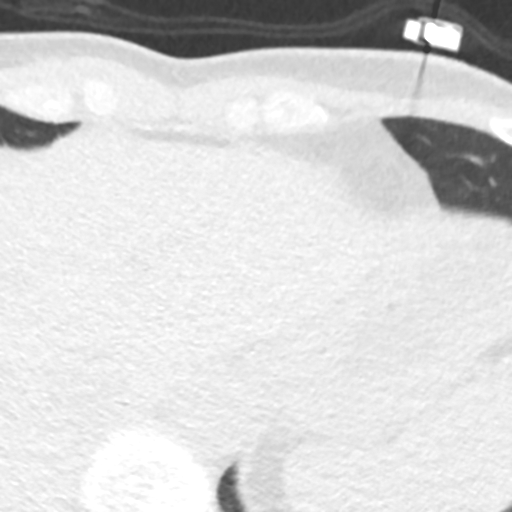
[im 12/54  vessel]
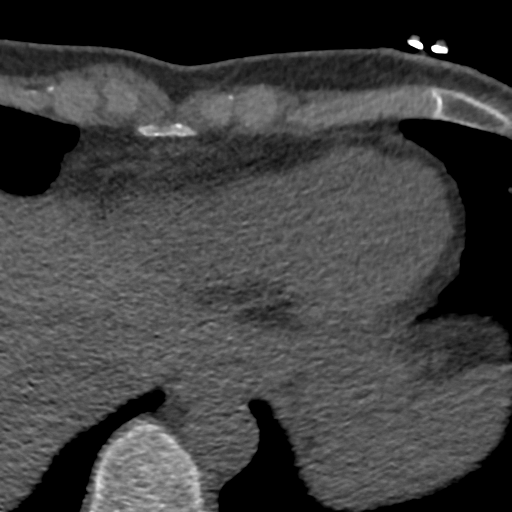
[im 18/54  vessel]
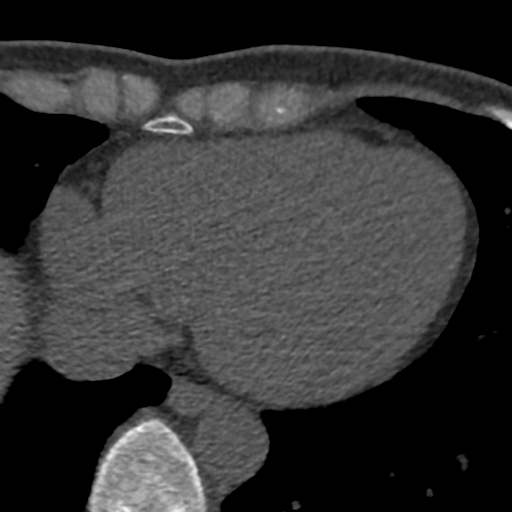
[im 24/54  vessel]
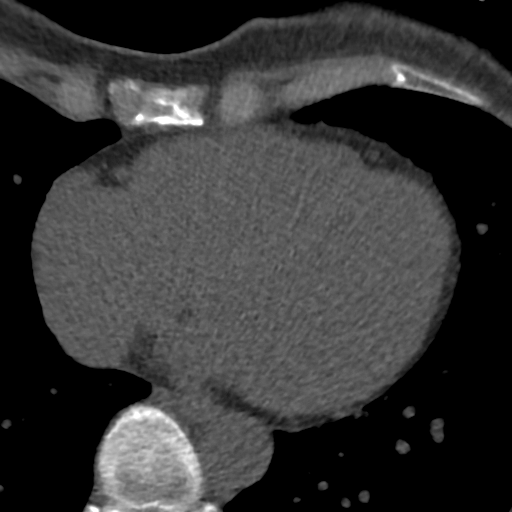
[im 30/54  vessel]
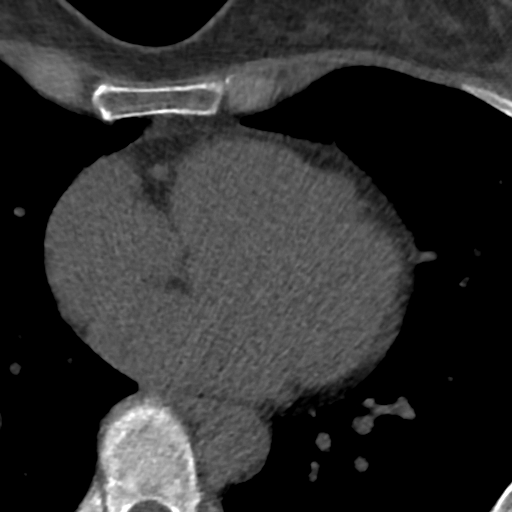
[im 30/54  lung]
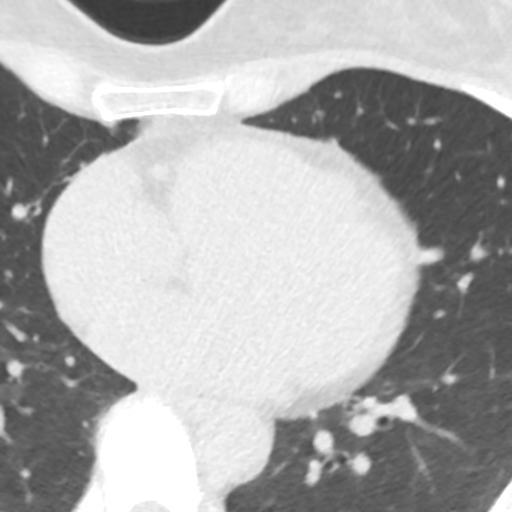
[im 36/54  vessel]
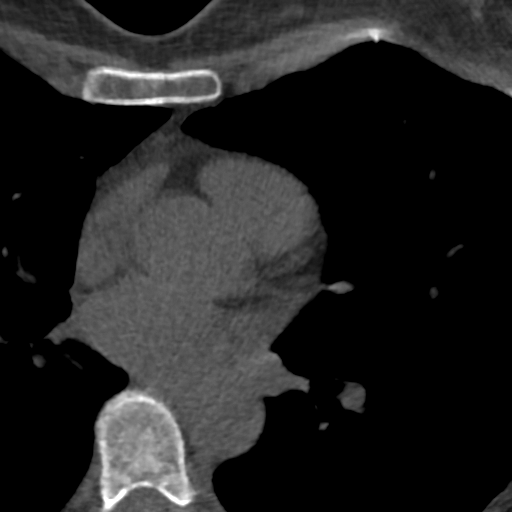
[im 42/54  vessel]
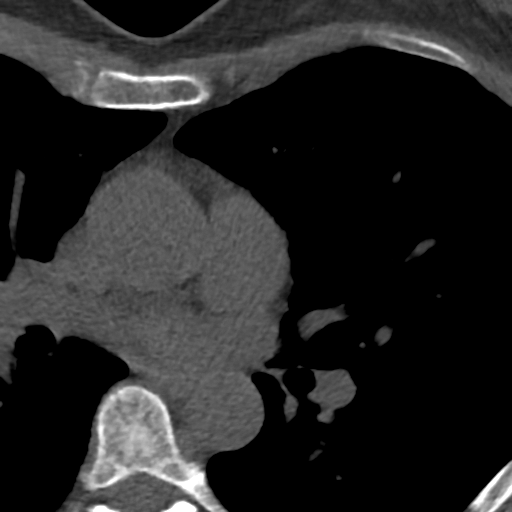
[im 48/54  vessel]
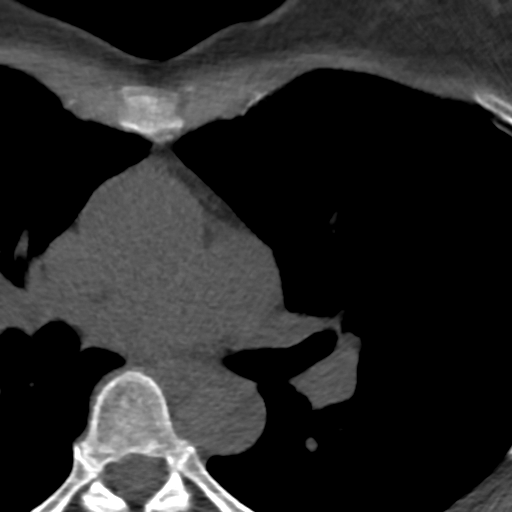

[Series 5: lung st 68 % · axial · 0.56mm/px · z∈[-229,-139]mm · 6 of 54 slices shown]
[im 6/54  lung]
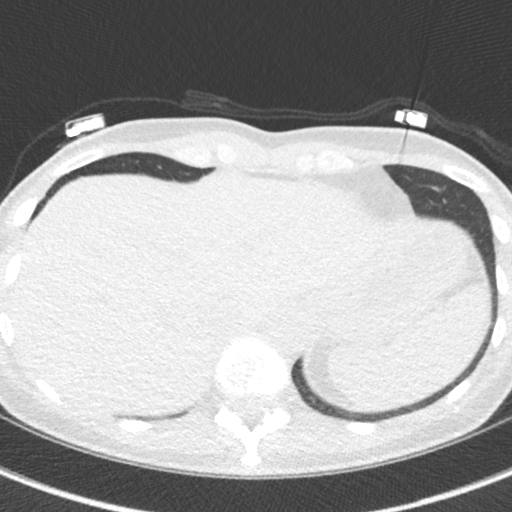
[im 12/54  lung]
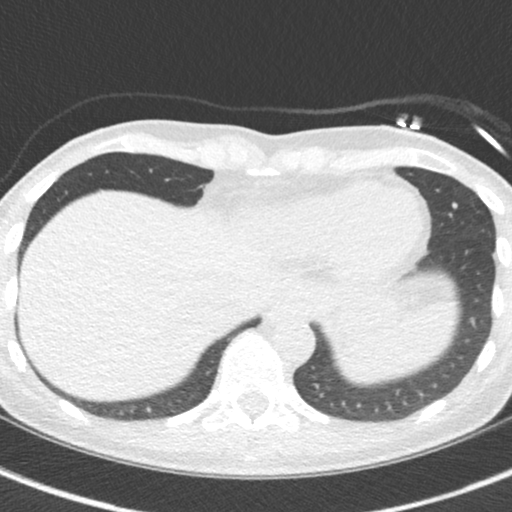
[im 18/54  lung]
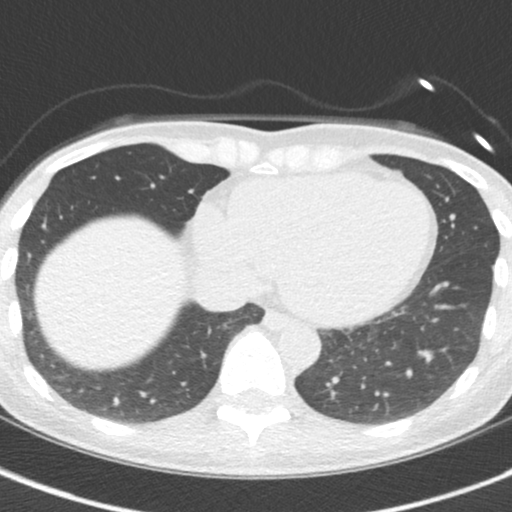
[im 24/54  lung]
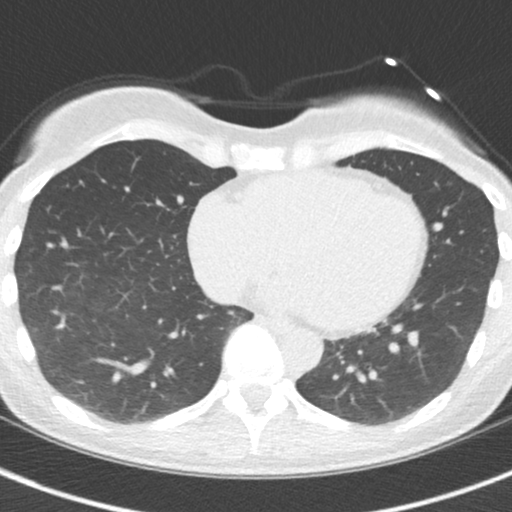
[im 30/54  lung]
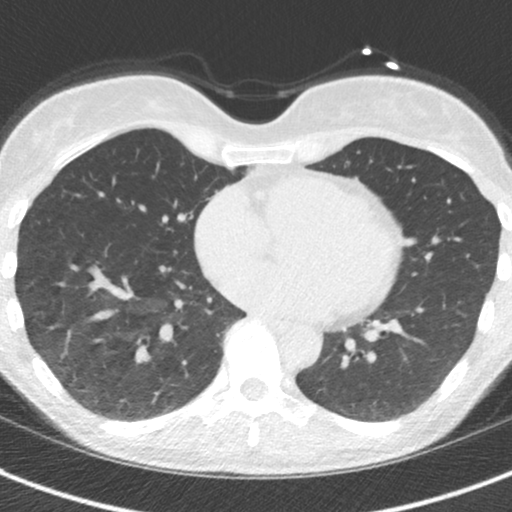
[im 36/54  lung]
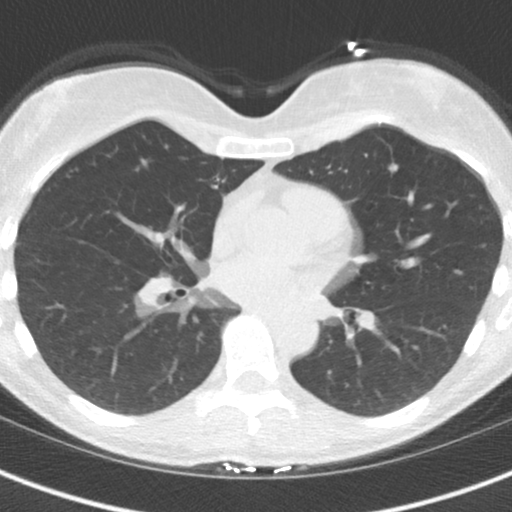

[14 of 20 positions shown; findings below may reference images not displayed]

FINDINGS: Vascular: Early aneurysmal dilatation of the ascending thoracic
aorta, 4.0 cm. Heart is upper limits normal in size.

Mediastinum/Nodes: No adenopathy in the lower mediastinum or hila.

Lungs/Pleura: Visualized lungs clear.  No effusions.

Upper Abdomen: Imaging into the upper abdomen shows no acute
findings.

Musculoskeletal: Chest wall soft tissues are unremarkable. No acute
bony abnormality.
IMPRESSION: Early aneurysmal dilatation of the ascending thoracic aorta, 4.0 cm
maximally. Recommend annual imaging followup by CTA or MRA. This
recommendation follows 2676
ACCF/AHA/AATS/ACR/ASA/SCA/AFONSO/BYRON DANIEL/CHAUKAIYIN/ERISSON Guidelines for the
Diagnosis and Management of Patients with Thoracic Aortic Disease.
Circulation. 2676; 121: E266-e369. Aortic aneurysm NOS (O1YO3-0G9.J)
FINDINGS: Non-cardiac: See separate report from [REDACTED].

Ascending Aorta: Upper normal size of the ascending aorta measuring
40 mm in the axial views. No calcifications.

Pericardium: Normal.

Coronary arteries: Normal origin.
IMPRESSION: 1. Coronary calcium score of 0. This was 0 percentile for age and
sex matched control.

2. Upper normal size of the ascending aorta measuring 40 mm in the
axial views. A dedicated chest CTA or MRA is recommended.

*** End of Addendum ***
Addendum:
EXAM:
OVER-READ INTERPRETATION  CT CHEST

The following report is an over-read performed by radiologist Dr.
Flaquita Utley [REDACTED] on 04/30/2018. This over-read
does not include interpretation of cardiac or coronary anatomy or
pathology. The coronary calcium score interpretation by the
cardiologist is attached.
FINDINGS: Vascular: Early aneurysmal dilatation of the ascending thoracic
aorta, 4.0 cm. Heart is upper limits normal in size.

Mediastinum/Nodes: No adenopathy in the lower mediastinum or hila.

Lungs/Pleura: Visualized lungs clear.  No effusions.

Upper Abdomen: Imaging into the upper abdomen shows no acute
findings.

Musculoskeletal: Chest wall soft tissues are unremarkable. No acute
bony abnormality.
IMPRESSION: Early aneurysmal dilatation of the ascending thoracic aorta, 4.0 cm
maximally. Recommend annual imaging followup by CTA or MRA. This
recommendation follows 2676
ACCF/AHA/AATS/ACR/ASA/SCA/AFONSO/BYRON DANIEL/CHAUKAIYIN/ERISSON Guidelines for the
Diagnosis and Management of Patients with Thoracic Aortic Disease.
Circulation. 2676; 121: [id number]. Aortic aneurysm NOS (O1YO3-0G9.J)
FINDINGS: Non-cardiac: See separate report from [REDACTED].

Ascending Aorta: Upper normal size of the ascending aorta measuring
40 mm in the axial views. No calcifications.

Pericardium: Normal.

Coronary arteries: Normal origin.
IMPRESSION: 1. Coronary calcium score of 0. This was 0 percentile for age and
sex matched control.

2. Upper normal size of the ascending aorta measuring 40 mm in the
axial views. A dedicated chest CTA or MRA is recommended.

*** End of Addendum ***
EXAM:
OVER-READ INTERPRETATION  CT CHEST

The following report is an over-read performed by radiologist Dr.
Flaquita Utley [REDACTED] on 04/30/2018. This over-read
does not include interpretation of cardiac or coronary anatomy or
pathology. The coronary calcium score interpretation by the
cardiologist is attached.
FINDINGS: Vascular: Early aneurysmal dilatation of the ascending thoracic
aorta, 4.0 cm. Heart is upper limits normal in size.

Mediastinum/Nodes: No adenopathy in the lower mediastinum or hila.

Lungs/Pleura: Visualized lungs clear.  No effusions.

Upper Abdomen: Imaging into the upper abdomen shows no acute
findings.

Musculoskeletal: Chest wall soft tissues are unremarkable. No acute
bony abnormality.
IMPRESSION: Early aneurysmal dilatation of the ascending thoracic aorta, 4.0 cm
maximally. Recommend annual imaging followup by CTA or MRA. This
recommendation follows 2676
ACCF/AHA/AATS/ACR/ASA/SCA/AFONSO/BYRON DANIEL/CHAUKAIYIN/ERISSON Guidelines for the
Diagnosis and Management of Patients with Thoracic Aortic Disease.
Circulation. 2676; 121: [id number]. Aortic aneurysm NOS (O1YO3-0G9.J)

## 2021-03-06 ENCOUNTER — Other Ambulatory Visit (HOSPITAL_BASED_OUTPATIENT_CLINIC_OR_DEPARTMENT_OTHER): Payer: Self-pay

## 2021-03-06 ENCOUNTER — Encounter (HOSPITAL_BASED_OUTPATIENT_CLINIC_OR_DEPARTMENT_OTHER): Payer: Self-pay | Admitting: Nurse Practitioner

## 2021-03-06 ENCOUNTER — Ambulatory Visit (INDEPENDENT_AMBULATORY_CARE_PROVIDER_SITE_OTHER): Payer: No Typology Code available for payment source | Admitting: Nurse Practitioner

## 2021-03-06 ENCOUNTER — Other Ambulatory Visit: Payer: Self-pay

## 2021-03-06 VITALS — BP 112/80 | HR 4 | Ht 66.0 in | Wt 158.0 lb

## 2021-03-06 DIAGNOSIS — R051 Acute cough: Secondary | ICD-10-CM | POA: Diagnosis not present

## 2021-03-06 DIAGNOSIS — H65192 Other acute nonsuppurative otitis media, left ear: Secondary | ICD-10-CM | POA: Diagnosis not present

## 2021-03-06 DIAGNOSIS — Z1329 Encounter for screening for other suspected endocrine disorder: Secondary | ICD-10-CM

## 2021-03-06 DIAGNOSIS — Z23 Encounter for immunization: Secondary | ICD-10-CM

## 2021-03-06 DIAGNOSIS — Z130 Encounter for screening for diseases of the blood and blood-forming organs and certain disorders involving the immune mechanism: Secondary | ICD-10-CM

## 2021-03-06 DIAGNOSIS — Z0000 Encounter for general adult medical examination without abnormal findings: Secondary | ICD-10-CM

## 2021-03-06 DIAGNOSIS — Z1321 Encounter for screening for nutritional disorder: Secondary | ICD-10-CM

## 2021-03-06 DIAGNOSIS — Z13228 Encounter for screening for other metabolic disorders: Secondary | ICD-10-CM

## 2021-03-06 DIAGNOSIS — I7121 Aneurysm of the ascending aorta, without rupture: Secondary | ICD-10-CM

## 2021-03-06 MED ORDER — HYDROCODONE BIT-HOMATROP MBR 5-1.5 MG/5ML PO SOLN
5.0000 mL | Freq: Two times a day (BID) | ORAL | 0 refills | Status: DC | PRN
  Filled 2021-03-06: qty 70, 7d supply, fill #0

## 2021-03-06 MED ORDER — ZOSTER VAC RECOMB ADJUVANTED 50 MCG/0.5ML IM SUSR
0.5000 mL | Freq: Once | INTRAMUSCULAR | 1 refills | Status: AC
  Filled 2021-03-06 – 2021-11-22 (×2): qty 0.5, 1d supply, fill #0

## 2021-03-06 MED ORDER — MOMETASONE FUROATE 50 MCG/ACT NA SUSP
NASAL | 6 refills | Status: DC
  Filled 2021-03-06: qty 17, 30d supply, fill #0

## 2021-03-06 NOTE — Patient Instructions (Signed)
Thank you for choosing [facility] at [facility] for your Primary Care needs. I am excited for the opportunity to partner with you to meet your health care goals. It was a pleasure meeting you today!  Recommendations from today's visit: We will see what your labs show and determine if we need to make any next steps  Information on diet, exercise, and health maintenance recommendations are listed below. This is information to help you be sure you are on track for optimal health and monitoring.   Please look over this and let us know if you have any questions or if you have completed any of the health maintenance outside of [facility] so that we can be sure your records are up to date.  ___________________________________________________________ About Me: I am an Adult-Geriatric Nurse Practitioner with a background in caring for patients for more than 20 years with a strong intensive care background. I provide primary care and sports medicine services to patients age 38 and older within this office. My education had a strong focus on caring for the older adult population, which I am passionate about. I am also the director of the APP Fellowship with [facility].   My desire is to provide you with the best service through preventive medicine and supportive care. I consider you a part of the medical team and value your input. I work diligently to ensure that you are heard and your needs are met in a safe and effective manner. I want you to feel comfortable with me as your provider and want you to know that your health concerns are important to me.  For your information, our office hours are: Monday, Tuesday, and Thursday 8:00 AM - 5:00 PM Wednesday and Friday 8:00 AM - 12:00 PM.   In my time away from the office I am teaching new APP's within the system and am unavailable, but my partner, Dr. Burnard Bunting is in the office for emergent needs.   If you have questions or concerns, please  call our office at [phone] or send [place] a MyChart message and we will respond as quickly as possible.  ____________________________________________________________ MyChart:  For all urgent or time sensitive needs we ask that you please call the office to avoid delays. Our number is [phone]. MyChart is not constantly monitored and due to the large volume of messages a day, replies may take up to 72 business hours.  MyChart Policy: MyChart allows for you to see your visit notes, after visit summary, provider recommendations, lab and tests results, make an appointment, request refills, and contact your provider or the office for non-urgent questions or concerns. Providers are seeing patients during normal business hours and do not have built in time to review MyChart messages.  We ask that you allow a minimum of 3 business days for responses to [employer]. For this reason, please do not send urgent requests through [place]. Please call the office at [phone]. New and ongoing conditions may require a visit. We have virtual and in person visit available for your convenience.  Complex MyChart concerns may require a visit. Your provider may request you schedule a virtual or in person visit to ensure we are providing the best care possible. MyChart messages sent after 11:00 AM on Friday will not be received by the provider until Monday morning.    Lab and Test Results: You will receive your lab and test results on MyChart as soon as they are completed and results have been  sent by the lab or testing facility. Due to this service, you will receive your results BEFORE your provider.  I review lab and tests results each morning prior to seeing patients. Some results require collaboration with other providers to ensure you are receiving the most appropriate care. For this reason, we ask that you please allow a minimum of 3-5 business days from the time the ALL results have been received for  your provider to receive and review lab and test results and contact you about these.  Most lab and test result comments from the provider will be sent through [place]. Your provider may recommend changes to the plan of care, follow-up visits, repeat testing, ask questions, or request an office visit to discuss these results. You may reply directly to this message or call the office at [phone] to provide information for the provider or set up an appointment. In some instances, you will be called with test results and recommendations. Please let us know if this is preferred and we will make note of this in your chart to provide this for you.    If you have not heard a response to your lab or test results in 5 business days from all results returning to [place], please call the office to let us know. We ask that you please avoid calling prior to this time unless there is an emergent concern. Due to high call volumes, this can delay the resulting process.  After Hours: For all non-emergency after hours needs, please call the office at [phone] and select the option to reach the on-call provider service. On-call services are shared between multiple [place] offices and therefore it will not be possible to speak directly with your provider. On-call providers may provide medical advice and recommendations, but are unable to provide refills for maintenance medications.  For all emergency or urgent medical needs after normal business hours, we recommend that you seek care at the closest Urgent Care or Emergency Department to ensure appropriate treatment in a timely manner.  [facility] [place] has a 24 hour emergency room located on the ground floor for your convenience.   Urgent Concerns During the Business Day Providers are seeing patients from 8AM to [place] with a busy schedule and are most often not able to respond to non-urgent calls until the end of the day or the next business  day. If you should have URGENT concerns during the day, please call and speak to the nurse or schedule a same day appointment so that we can address your concern without delay.   Thank you, again, for choosing me as your health care partner. I appreciate your trust and look forward to learning more about you.   Worthy Keeler, DNP, AGNP-c ___________________________________________________________  Health Maintenance Recommendations Screening Testing Mammogram Every 1 -2 years based on history and risk factors Starting at age 29 Pap Smear Ages 21-39 every 3 years Ages 5-65 every 5 years with HPV testing More frequent testing may be required based on results and history Colon Cancer Screening Every 1-10 years based on test performed, risk factors, and history Starting at age 56 Bone Density Screening Every 2-10 years based on history Starting at age 31 for women Recommendations for men differ based on medication usage, history, and risk factors AAA Screening One time ultrasound Men 93-68 years old who have every smoked Lung Cancer Screening Low Dose Lung CT every 12 months Age 62-80 years with a 30 pack-year smoking history who still smoke  or who have quit within the last 15 years  Screening Labs Routine  Labs: Complete Blood Count (CBC), Complete Metabolic Panel (CMP), Cholesterol (Lipid Panel) Every 6-12 months based on history and medications May be recommended more frequently based on current conditions or previous results Hemoglobin A1c Lab Every 3-12 months based on history and previous results Starting at age 101 or earlier with diagnosis of diabetes, high cholesterol, BMI >26, and/or risk factors Frequent monitoring for patients with diabetes to ensure blood sugar control Thyroid Panel (TSH w/ T3 & T4) Every 6 months based on history, symptoms, and risk factors May be repeated more often if on medication HIV One time testing for all patients 18 and older May be  repeated more frequently for patients with increased risk factors or exposure Hepatitis C One time testing for all patients 29 and older May be repeated more frequently for patients with increased risk factors or exposure Gonorrhea, Chlamydia Every 12 months for all sexually active persons 13-24 years Additional monitoring may be recommended for those who are considered high risk or who have symptoms PSA Men 94-54 years old with risk factors Additional screening may be recommended from age 82-69 based on risk factors, symptoms, and history  Vaccine Recommendations Tetanus Booster All adults every 10 years Flu Vaccine All patients 6 months and older every year COVID Vaccine All patients 12 years and older Initial dosing with booster May recommend additional booster based on age and health history HPV Vaccine 2 doses all patients age 44-26 Dosing may be considered for patients over 26 Shingles Vaccine (Shingrix) 2 doses all adults 97 years and older Pneumonia (Pneumovax 23) All adults 81 years and older May recommend earlier dosing based on health history Pneumonia (Prevnar 51) All adults 22 years and older Dosed 1 year after Pneumovax 23  Additional Screening, Testing, and Vaccinations may be recommended on an individualized basis based on family history, health history, risk factors, and/or exposure.  __________________________________________________________  Diet Recommendations for All Patients  I recommend that all patients maintain a diet low in saturated fats, carbohydrates, and cholesterol. While this can be challenging at first, it is not impossible and small changes can make big differences.  Things to try: Decreasing the amount of soda, sweet tea, and/or juice to one or less per day and replace with water While water is always the first choice, if you do not like water you may consider adding a water additive without sugar to improve the taste other sugar free  drinks Replace potatoes with a brightly colored vegetable at dinner Use healthy oils, such as canola oil or olive oil, instead of butter or hard margarine Limit your bread intake to two pieces or less a day Replace regular pasta with low carb pasta options Bake, broil, or grill foods instead of frying Monitor portion sizes  Eat smaller, more frequent meals throughout the day instead of large meals  An important thing to remember is, if you love foods that are not great for your health, you don't have to give them up completely. Instead, allow these foods to be a reward when you have done well. Allowing yourself to still have special treats every once in a while is a nice way to tell yourself thank you for working hard to keep yourself healthy.   Also remember that every day is a new day. If you have a bad day and "fall off the wagon", you can still climb right back up and keep moving along on your  journey!  We have resources available to help you!  Some websites that may be helpful include: www.http://carter.biz/  Www.VeryWellFit.com _____________________________________________________________  Activity Recommendations for All Patients  I recommend that all adults get at least 20 minutes of moderate physical activity that elevates your heart rate at least 5 days out of the week.  Some examples include: Walking or jogging at a pace that allows you to carry on a conversation Cycling (stationary bike or outdoors) Water aerobics Yoga Weight lifting Dancing If physical limitations prevent you from putting stress on your joints, exercise in a pool or seated in a chair are excellent options.  Do determine your MAXIMUM heart rate for activity: YOUR AGE - 220 = MAX HeartRate   Remember! Do not push yourself too hard.  Start slowly and build up your pace, speed, weight, time in exercise, etc.  Allow your body to rest between exercise and get good sleep. You will need more water than normal when  you are exerting yourself. Do not wait until you are thirsty to drink. Drink with a purpose of getting in at least 8, 8 ounce glasses of water a day plus more depending on how much you exercise and sweat.    If you begin to develop dizziness, chest pain, abdominal pain, jaw pain, shortness of breath, headache, vision changes, lightheadedness, or other concerning symptoms, stop the activity and allow your body to rest. If your symptoms are severe, seek emergency evaluation immediately. If your symptoms are concerning, but not severe, please let us know so that we can recommend further evaluation.

## 2021-03-06 NOTE — Progress Notes (Signed)
Hannah Eth, DNP, AGNP-c [facility] Medicine [address]   [address] [place], [place] [address] [phone]  New patient visit   Patient: Hannah Galloway   DOB: 22-Apr-1965   56 y.o. Female  MRN: [id number] Visit Date: 03/06/2021  Patient Care Team: Hannah Galloway, Hannah Amabile, NP as PCP - General (Nurse Practitioner) Hannah Red, MD as PCP - Cardiology (Cardiology)  Today's healthcare provider: Tollie Eth, NP   Chief Complaint  Patient presents with   Establish Care    Former PCP - Dr. Caryl Galloway - Notes in Epic. Patient is also followed by Cardiology - Dr. Cristal Galloway (notes in Epic)   URI    Patient has been feeling sick x 1 week. She has been tested 3 times for covid which have all been negative. She states currently she has a nagging cough that she cannot get rid of.    Subjective    HPI HPI     Establish Care    Additional comments: Former PCP - Dr. Caryl Galloway - Notes in Epic. Patient is also followed by Cardiology - Dr. Cristal Galloway (notes in Epic)        URI    Additional comments: Patient has been feeling sick x 1 week. She has been tested 3 times for covid which have all been negative. She states currently she has a nagging cough that she cannot get rid of.       Last edited by Hannah Galloway, CMA on 03/06/2021  8:45 AM.      Hannah Galloway is a 56 y.o. female who presents today as a new patient to establish care.    [name] has concerns for ongoing cough and congestion that has been present for about a week. She tested negative for COVID several times and most of her symptoms are improving, but she is unable to sleep well due to the cough, which is worse at night. She is also having some residual ear pain. She is not having fever or chills at this time. No sinus pain or pressure.   She reports a positive medical history of enlarged ascending aorta. She has been cautioned against using flouroquinolones by her cardiologist due to  increased risks with this medication. She is having no concerning symptoms with this and is monitored closely with Dr. Cristal Galloway.   Past Medical History:  Diagnosis Date   Ascending aortic aneurysm    Bilateral carpal tunnel syndrome 08/09/2018   Glaucoma    Past Surgical History:  Procedure Laterality Date   BACK SURGERY     SPINE SURGERY  July 2010   TONSILLECTOMY AND ADENOIDECTOMY  1974   Family Status  Relation Name Status   Father Hannah Galloway - deceased Deceased   Mother Hannah Galloway Alive   Brother Hannah Galloway (Not Specified)   MGM  Deceased   MGF  Deceased   PGM  Deceased   PGF  Deceased   Family History  Problem Relation Age of Onset   Stroke Father    Heart disease Father    Diabetes Father        type ll   Heart attack Father    Hypertension Father    Hypertension Mother    Arthritis Mother    Asthma Mother    Lymphoma Brother 80       t-call   Cancer Brother        T cell lymphoma   Social History   Socioeconomic History  Marital status: Married    Spouse name: Not on file   Number of children: 2   Years of education: Not on file   Highest education level: Not on file  Occupational History    Comment: Lab   Tobacco Use   Smoking status: Galloway   Smokeless tobacco: Galloway  Vaping Use   Vaping Use: Galloway used  Substance and Sexual Activity   Alcohol use: No   Drug use: Galloway   Sexual activity: Yes    Birth control/protection: Post-menopausal  Other Topics Concern   Not on file  Social History Narrative   Not on file   Social Determinants of Health   Financial Resource Strain: Not on file  Food Insecurity: Not on file  Transportation Needs: Not on file  Physical Activity: Not on file  Stress: Not on file  Social Connections: Not on file   Outpatient Medications Prior to Visit  Medication Sig   Multiple Vitamins-Minerals (MULTIVITAMIN ADULTS 50+ PO) Take 1 tablet by mouth daily.   No facility-administered medications prior to  visit.   Allergies  Allergen Reactions   Quinolones     Patient was warned about not using Cipro and similar antibiotics. Recent studies have raised concern that fluoroquinolone antibiotics could be associated with an increased risk of aortic aneurysm Fluoroquinolones have non-antimicrobial properties that might jeopardise the integrity of the extracellular matrix of the vascular wall In a  propensity score matched cohort study in [place], there was a 66% increased rate of aortic aneurysm or dissection associated with oral fluoroquinolone use, compared wit    Immunization History  Administered Date(s) Administered   Influenza Split 01/15/2012   Influenza,inj,Quad PF,6+ Mos 12/11/2020   PFIZER(Purple Top)SARS-COV-2 Vaccination 12/06/2019    Health Maintenance  Topic Date Due   Pneumococcal Vaccine 73-2 Years old (1 - PCV) Galloway done   HIV Screening  Galloway done   Hepatitis C Screening  Galloway done   TETANUS/TDAP  Galloway done   Zoster Vaccines- Shingrix (1 of 2) Galloway done   COVID-19 Vaccine (2 - Pfizer series) 12/27/2019   PAP SMEAR-Modifier  02/23/2021   MAMMOGRAM  02/14/2022   COLONOSCOPY (Pts 45-55yrs Insurance coverage will need to be confirmed)  07/05/2028   INFLUENZA VACCINE  Completed   HPV VACCINES  Aged Out    Patient Care Team: Hannah Galloway, Hannah Amabile, NP as PCP - General (Nurse Practitioner) Hannah Red, MD as PCP - Cardiology (Cardiology)  Review of Systems All review of systems negative except what is listed in the HPI   Objective    BP 112/80    Pulse (!) 4    Ht 5\' 6"  (1.676 m)    Wt 158 lb (71.7 kg)    SpO2 99%    BMI 25.50 kg/m  Physical Exam Vitals and nursing note reviewed.  Constitutional:      General: She is not in acute distress.    Appearance: Normal appearance.  HENT:     Right Ear: Tympanic membrane is bulging.     Left Ear: Tenderness present. A middle ear effusion is present. Tympanic membrane is bulging.  Eyes:     Extraocular Movements:  Extraocular movements intact.     Conjunctiva/sclera: Conjunctivae normal.     Pupils: Pupils are equal, round, and reactive to light.  Neck:     Vascular: No carotid bruit.  Cardiovascular:     Rate and Rhythm: Normal rate and regular rhythm.     Pulses: Normal pulses.  Heart sounds: Normal heart sounds. No murmur heard. Pulmonary:     Effort: Pulmonary effort is normal.     Breath sounds: Normal breath sounds. No wheezing.  Abdominal:     General: Bowel sounds are normal.     Palpations: Abdomen is soft.  Musculoskeletal:        General: Normal range of motion.     Cervical back: Normal range of motion.     Right lower leg: No edema.     Left lower leg: No edema.  Skin:    General: Skin is warm and dry.     Capillary Refill: Capillary refill takes less than 2 seconds.  Neurological:     General: No focal deficit present.     Mental Status: She is alert and oriented to person, place, and time.  Psychiatric:        Mood and Affect: Mood normal.        Behavior: Behavior normal.        Thought Content: Thought content normal.        Judgment: Judgment normal.    Depression Screen PHQ 2/9 Scores 03/08/2021  PHQ - 2 Score 0   Results for orders placed or performed in visit on 03/06/21  CBC with Differential/Platelet  Result Value Ref Range   WBC 6.8 3.4 - 10.8 x10E3/uL   RBC 4.23 3.77 - 5.28 x10E6/uL   Hemoglobin 13.5 11.1 - 15.9 g/dL   Hematocrit 16.0 10.9 - 46.6 %   MCV 95 79 - 97 fL   MCH 31.9 26.6 - 33.0 pg   MCHC 33.8 31.5 - 35.7 g/dL   RDW 32.3 55.7 - 32.2 %   Platelets 289 150 - 450 x10E3/uL   Neutrophils 63 Not Estab. %   Lymphs 25 Not Estab. %   Monocytes 9 Not Estab. %   Eos 2 Not Estab. %   Basos 1 Not Estab. %   Neutrophils Absolute 4.2 1.4 - 7.0 x10E3/uL   Lymphocytes Absolute 1.7 0.7 - 3.1 x10E3/uL   Monocytes Absolute 0.6 0.1 - 0.9 x10E3/uL   EOS (ABSOLUTE) 0.2 0.0 - 0.4 x10E3/uL   Basophils Absolute 0.1 0.0 - 0.2 x10E3/uL   Immature  Granulocytes 0 Not Estab. %   Immature Grans (Abs) 0.0 0.0 - 0.1 x10E3/uL  Comprehensive metabolic panel  Result Value Ref Range   Glucose 98 70 - 99 mg/dL   BUN 14 6 - 24 mg/dL   Creatinine, Ser 0.25 0.57 - 1.00 mg/dL   eGFR 83 >42 HC/WCB/7.62   BUN/Creatinine Ratio 17 9 - 23   Sodium 138 134 - 144 mmol/L   Potassium 4.6 3.5 - 5.2 mmol/L   Chloride 99 96 - 106 mmol/L   CO2 26 20 - 29 mmol/L   Calcium 9.7 8.7 - 10.2 mg/dL   Total Protein 7.1 6.0 - 8.5 g/dL   Albumin 4.9 3.8 - 4.9 g/dL   Globulin, Total 2.2 1.5 - 4.5 g/dL   Albumin/Globulin Ratio 2.2 1.2 - 2.2   Bilirubin Total 0.4 0.0 - 1.2 mg/dL   Alkaline Phosphatase 74 44 - 121 IU/L   AST 20 0 - 40 IU/L   ALT 12 0 - 32 IU/L  Lipid panel  Result Value Ref Range   Cholesterol, Total 204 (H) 100 - 199 mg/dL   Triglycerides 65 0 - 149 mg/dL   HDL 67 >83 mg/dL   VLDL Cholesterol Cal 12 5 - 40 mg/dL   LDL Chol Calc (NIH) 151 (H)  0 - 99 mg/dL   Chol/HDL Ratio 3.0 0.0 - 4.4 ratio  TSH  Result Value Ref Range   TSH 1.050 0.450 - 4.500 uIU/mL  VITAMIN D 25 Hydroxy (Vit-D Deficiency, Fractures)  Result Value Ref Range   Vit D, 25-Hydroxy 35.6 30.0 - 100.0 ng/mL  Iron, TIBC and Ferritin Panel  Result Value Ref Range   Total Iron Binding Capacity 330 250 - 450 ug/dL   UIBC 161 096 - 045 ug/dL   Iron 92 27 - 409 ug/dL   Iron Saturation 28 15 - 55 %   Ferritin 96 15 - 150 ng/mL    Assessment & Plan      Problem List Items Addressed This Visit     Thoracic aortic aneurysm    Stable at this time No alarm symptoms presently Will monitor with cardiology Avoid fluoroquinolones.       Acute effusion of left ear    Acute effusion present.  Appears to be non-infectious at this time, but recommend monitoring for worsening pain or pressure for possible need for antibiotics as this has been present for more than a week at this time. She will contact me if she has further concerns.       Relevant Medications   mometasone  (NASONEX) 50 MCG/ACT nasal spray   Acute cough - Primary    Lingering cough following URI Likely reactive to inflammation, no signs of infection present today.  Lungs sound clear with no wheezing- will avoid steroids at this time, but consider if this does not improve.  Prescription medication sent for day time and night time use to help with rest.  She will contact me if symptoms worsen or fail to improve.       Relevant Medications   HYDROcodone bit-homatropine (HYCODAN) 5-1.5 MG/5ML syrup   Other Visit Diagnoses     Encounter for medical examination to establish care       Screening for deficiency anemia       Relevant Orders   CBC with Differential/Platelet (Completed)   Screening for endocrine, nutritional, metabolic and immunity disorder       Relevant Orders   Comprehensive metabolic panel (Completed)   Lipid panel (Completed)   TSH (Completed)   VITAMIN D 25 Hydroxy (Vit-D Deficiency, Fractures) (Completed)   Iron, TIBC and Ferritin Panel (Completed)   Need for shingles vaccine            Return in about 1 year (around 03/06/2022) for CPE today- CPE in 1 year.      Jediah Horger, Hannah Amabile, NP, DNP, AGNP-C [facility] at [facility]

## 2021-03-07 LAB — CBC WITH DIFFERENTIAL/PLATELET
Basophils Absolute: 0.1 10*3/uL (ref 0.0–0.2)
Basos: 1 %
EOS (ABSOLUTE): 0.2 10*3/uL (ref 0.0–0.4)
Eos: 2 %
Hematocrit: 40 % (ref 34.0–46.6)
Hemoglobin: 13.5 g/dL (ref 11.1–15.9)
Immature Grans (Abs): 0 10*3/uL (ref 0.0–0.1)
Immature Granulocytes: 0 %
Lymphocytes Absolute: 1.7 10*3/uL (ref 0.7–3.1)
Lymphs: 25 %
MCH: 31.9 pg (ref 26.6–33.0)
MCHC: 33.8 g/dL (ref 31.5–35.7)
MCV: 95 fL (ref 79–97)
Monocytes Absolute: 0.6 10*3/uL (ref 0.1–0.9)
Monocytes: 9 %
Neutrophils Absolute: 4.2 10*3/uL (ref 1.4–7.0)
Neutrophils: 63 %
Platelets: 289 10*3/uL (ref 150–450)
RBC: 4.23 x10E6/uL (ref 3.77–5.28)
RDW: 12.4 % (ref 11.7–15.4)
WBC: 6.8 10*3/uL (ref 3.4–10.8)

## 2021-03-07 LAB — COMPREHENSIVE METABOLIC PANEL
ALT: 12 IU/L (ref 0–32)
AST: 20 IU/L (ref 0–40)
Albumin/Globulin Ratio: 2.2 (ref 1.2–2.2)
Albumin: 4.9 g/dL (ref 3.8–4.9)
Alkaline Phosphatase: 74 IU/L (ref 44–121)
BUN/Creatinine Ratio: 17 (ref 9–23)
BUN: 14 mg/dL (ref 6–24)
Bilirubin Total: 0.4 mg/dL (ref 0.0–1.2)
CO2: 26 mmol/L (ref 20–29)
Calcium: 9.7 mg/dL (ref 8.7–10.2)
Chloride: 99 mmol/L (ref 96–106)
Creatinine, Ser: 0.84 mg/dL (ref 0.57–1.00)
Globulin, Total: 2.2 g/dL (ref 1.5–4.5)
Glucose: 98 mg/dL (ref 70–99)
Potassium: 4.6 mmol/L (ref 3.5–5.2)
Sodium: 138 mmol/L (ref 134–144)
Total Protein: 7.1 g/dL (ref 6.0–8.5)
eGFR: 83 mL/min/{1.73_m2} (ref >59)

## 2021-03-07 LAB — IRON,TIBC AND FERRITIN PANEL
Ferritin: 96 ng/mL (ref 15–150)
Iron Saturation: 28 % (ref 15–55)
Iron: 92 ug/dL (ref 27–159)
Total Iron Binding Capacity: 330 ug/dL (ref 250–450)
UIBC: 238 ug/dL (ref 131–425)

## 2021-03-07 LAB — LIPID PANEL
Chol/HDL Ratio: 3 ratio (ref 0.0–4.4)
Cholesterol, Total: 204 mg/dL — ABNORMAL HIGH (ref 100–199)
HDL: 67 mg/dL (ref >39)
LDL Chol Calc (NIH): 125 mg/dL — ABNORMAL HIGH (ref 0–99)
Triglycerides: 65 mg/dL (ref 0–149)
VLDL Cholesterol Cal: 12 mg/dL (ref 5–40)

## 2021-03-07 LAB — VITAMIN D 25 HYDROXY (VIT D DEFICIENCY, FRACTURES): Vit D, 25-Hydroxy: 35.6 ng/mL (ref 30.0–100.0)

## 2021-03-07 LAB — TSH: TSH: 1.05 u[IU]/mL (ref 0.450–4.500)

## 2021-03-08 DIAGNOSIS — Z0000 Encounter for general adult medical examination without abnormal findings: Secondary | ICD-10-CM | POA: Insufficient documentation

## 2021-03-08 DIAGNOSIS — H65192 Other acute nonsuppurative otitis media, left ear: Secondary | ICD-10-CM | POA: Insufficient documentation

## 2021-03-08 DIAGNOSIS — R051 Acute cough: Secondary | ICD-10-CM | POA: Insufficient documentation

## 2021-03-08 NOTE — Assessment & Plan Note (Signed)
Acute effusion present.  Appears to be non-infectious at this time, but recommend monitoring for worsening pain or pressure for possible need for antibiotics as this has been present for more than a week at this time. She will contact me if she has further concerns.

## 2021-03-08 NOTE — Assessment & Plan Note (Signed)
Stable at this time No alarm symptoms presently Will monitor with cardiology Avoid fluoroquinolones.

## 2021-03-08 NOTE — Assessment & Plan Note (Signed)
Lingering cough following URI Likely reactive to inflammation, no signs of infection present today.  Lungs sound clear with no wheezing- will avoid steroids at this time, but consider if this does not improve.  Prescription medication sent for day time and night time use to help with rest.  She will contact me if symptoms worsen or fail to improve.

## 2021-03-27 ENCOUNTER — Ambulatory Visit (HOSPITAL_BASED_OUTPATIENT_CLINIC_OR_DEPARTMENT_OTHER): Payer: No Typology Code available for payment source | Admitting: Obstetrics & Gynecology

## 2021-04-03 ENCOUNTER — Other Ambulatory Visit (HOSPITAL_BASED_OUTPATIENT_CLINIC_OR_DEPARTMENT_OTHER): Payer: Self-pay | Admitting: Obstetrics & Gynecology

## 2021-04-03 DIAGNOSIS — Z1231 Encounter for screening mammogram for malignant neoplasm of breast: Secondary | ICD-10-CM

## 2021-04-08 ENCOUNTER — Ambulatory Visit (HOSPITAL_BASED_OUTPATIENT_CLINIC_OR_DEPARTMENT_OTHER)
Admission: RE | Admit: 2021-04-08 | Discharge: 2021-04-08 | Disposition: A | Discharge status: Home / Self Care | Payer: No Typology Code available for payment source | Source: Ambulatory Visit | Attending: Obstetrics & Gynecology | Admitting: Obstetrics & Gynecology

## 2021-04-08 ENCOUNTER — Encounter (HOSPITAL_BASED_OUTPATIENT_CLINIC_OR_DEPARTMENT_OTHER): Payer: Self-pay | Admitting: Radiology

## 2021-04-08 ENCOUNTER — Other Ambulatory Visit: Payer: Self-pay

## 2021-04-08 DIAGNOSIS — Z1231 Encounter for screening mammogram for malignant neoplasm of breast: Secondary | ICD-10-CM | POA: Diagnosis present

## 2021-04-29 ENCOUNTER — Encounter (HOSPITAL_BASED_OUTPATIENT_CLINIC_OR_DEPARTMENT_OTHER): Payer: Self-pay | Admitting: *Deleted

## 2021-04-30 ENCOUNTER — Ambulatory Visit (HOSPITAL_BASED_OUTPATIENT_CLINIC_OR_DEPARTMENT_OTHER): Payer: No Typology Code available for payment source | Admitting: Obstetrics & Gynecology

## 2021-05-14 ENCOUNTER — Other Ambulatory Visit: Payer: Self-pay

## 2021-05-14 ENCOUNTER — Encounter (HOSPITAL_BASED_OUTPATIENT_CLINIC_OR_DEPARTMENT_OTHER): Payer: Self-pay | Admitting: Obstetrics & Gynecology

## 2021-05-14 ENCOUNTER — Ambulatory Visit (INDEPENDENT_AMBULATORY_CARE_PROVIDER_SITE_OTHER): Payer: No Typology Code available for payment source | Admitting: Obstetrics & Gynecology

## 2021-05-14 VITALS — BP 131/88 | HR 54 | Ht 66.0 in | Wt 156.6 lb

## 2021-05-14 DIAGNOSIS — Z01419 Encounter for gynecological examination (general) (routine) without abnormal findings: Secondary | ICD-10-CM

## 2021-05-14 DIAGNOSIS — I7121 Aneurysm of the ascending aorta, without rupture: Secondary | ICD-10-CM

## 2021-05-14 DIAGNOSIS — Z780 Asymptomatic menopausal state: Secondary | ICD-10-CM | POA: Diagnosis not present

## 2021-05-14 NOTE — Progress Notes (Signed)
56 y.o. G2P2 Married White or Caucasian female here for annual exam/new patient exam.  Last cycle was about three years.  She did use OCPs in the past.  Her transition into menopause was uneventful. ? ?No LMP recorded. Patient is perimenopausal.          ?Sexually active: Yes.    ?The current method of family planning is post menopausal status.    ?Exercising: Yes.     ?Smoker:  no ? ?Health Maintenance: ?Pap:  2021, she thinks ?History of abnormal Pap:  no ?MMG:  04/08/2021 ?Colonoscopy:  done with Dr. Benson Norway.  Release will be signed ?BMD:   guidelines reviewed ?Screening Labs: done with Jacolyn Reedy, NP.  Reviewed results. ? ? reports that she has never smoked. She has never used smokeless tobacco. She reports that she does not drink alcohol and does not use drugs. ? ?Past Medical History:  ?Diagnosis Date  ? Ascending aortic aneurysm   ? Bilateral carpal tunnel syndrome 08/09/2018  ? Glaucoma   ? ? ?Past Surgical History:  ?Procedure Laterality Date  ? BACK SURGERY    ? SPINE SURGERY  July 2010  ? TONSILLECTOMY AND ADENOIDECTOMY  1974  ? ? ?Current Outpatient Medications  ?Medication Sig Dispense Refill  ? Multiple Vitamins-Minerals (MULTIVITAMIN ADULTS 50+ PO) Take 1 tablet by mouth daily.    ? ?No current facility-administered medications for this visit.  ? ? ?Family History  ?Problem Relation Age of Onset  ? Stroke Father   ? Heart disease Father   ? Diabetes Father   ?     type ll  ? Heart attack Father   ? Hypertension Father   ? Hypertension Mother   ? Arthritis Mother   ? Asthma Mother   ? Lymphoma Brother 14  ?     t-call  ? Cancer Brother   ?     T cell lymphoma  ? ? ?Review of Systems  ?All other systems reviewed and are negative. ? ?Exam:   ?BP 131/88 (BP Location: Right Arm, Patient Position: Sitting, Cuff Size: Normal)   Pulse (!) 54   Ht '5\' 6"'$  (1.676 m) Comment: reported  Wt 156 lb 9.6 oz (71 kg)   BMI 25.28 kg/m?   Height: '5\' 6"'$  (167.6 cm) (reported) ? ?General appearance: alert, cooperative and  appears stated age ?Head: Normocephalic, without obvious abnormality, atraumatic ?Neck: no adenopathy, supple, symmetrical, trachea midline and thyroid normal to inspection and palpation ?Lungs: clear to auscultation bilaterally ?Breasts: normal appearance, no masses or tenderness ?Heart: regular rate and rhythm ?Abdomen: soft, non-tender; bowel sounds normal; no masses,  no organomegaly ?Extremities: extremities normal, atraumatic, no cyanosis or edema ?Skin: Skin color, texture, turgor normal. No rashes or lesions ?Lymph nodes: Cervical, supraclavicular, and axillary nodes normal. ?No abnormal inguinal nodes palpated ?Neurologic: Grossly normal ? ? ?Pelvic: External genitalia:  no lesions ?             Urethra:  normal appearing urethra with no masses, tenderness or lesions ?             Bartholins and Skenes: normal    ?             Vagina: normal appearing vagina with normal color and no discharge, no lesions ?             Cervix: no lesions ?             Pap taken: obtained and held. ?Bimanual Exam:  Uterus:  normal size, contour, position, consistency, mobility, non-tender ?             Adnexa: normal adnexa and no mass, fullness, tenderness ?              Rectovaginal: Confirms ?              Anus:  normal sphincter tone, no lesions ? ?Chaperone, Octaviano Batty, CMA, was present for exam. ? ?Assessment/Plan: ?1. Well woman exam with routine gynecological exam ?- pt feels pretty sure she had a pap smear obtained in 2021.  Records requested.  Pap was obtained and held.  Will decide if needs to be ordered once have outside records ?- MMG 04/08/2021 ?- colonoscopy done with Dr. Benson Norway.  Releast signed ?- bone density guidelines reviewed ?- lab work done with Clarise Cruz Early ?- vaccines reviewed/updated ? ?2. Postmenopausal ?- no HRT ? ?3. Aneurysm of ascending aorta without rupture ?- having imaging every 2 years for follow up  ? ?

## 2021-05-15 ENCOUNTER — Encounter (HOSPITAL_BASED_OUTPATIENT_CLINIC_OR_DEPARTMENT_OTHER): Payer: Self-pay | Admitting: Nurse Practitioner

## 2021-05-22 ENCOUNTER — Encounter (HOSPITAL_BASED_OUTPATIENT_CLINIC_OR_DEPARTMENT_OTHER): Payer: Self-pay | Admitting: *Deleted

## 2021-05-22 ENCOUNTER — Encounter (HOSPITAL_BASED_OUTPATIENT_CLINIC_OR_DEPARTMENT_OTHER): Payer: Self-pay | Admitting: Obstetrics & Gynecology

## 2021-05-22 ENCOUNTER — Other Ambulatory Visit (HOSPITAL_BASED_OUTPATIENT_CLINIC_OR_DEPARTMENT_OTHER): Payer: Self-pay | Admitting: Obstetrics & Gynecology

## 2021-05-22 ENCOUNTER — Other Ambulatory Visit (HOSPITAL_COMMUNITY)
Admission: RE | Admit: 2021-05-22 | Discharge: 2021-05-22 | Disposition: A | Discharge status: Home / Self Care | Payer: No Typology Code available for payment source | Source: Ambulatory Visit | Attending: Obstetrics & Gynecology | Admitting: Obstetrics & Gynecology

## 2021-05-22 DIAGNOSIS — Z124 Encounter for screening for malignant neoplasm of cervix: Secondary | ICD-10-CM | POA: Diagnosis present

## 2021-05-27 LAB — CYTOLOGY - PAP
Comment: NEGATIVE
Diagnosis: NEGATIVE
High risk HPV: NEGATIVE

## 2021-05-30 ENCOUNTER — Encounter (HOSPITAL_BASED_OUTPATIENT_CLINIC_OR_DEPARTMENT_OTHER): Payer: Self-pay | Admitting: *Deleted

## 2021-06-07 ENCOUNTER — Other Ambulatory Visit (HOSPITAL_BASED_OUTPATIENT_CLINIC_OR_DEPARTMENT_OTHER): Payer: Self-pay

## 2021-06-07 MED ORDER — SHINGRIX 50 MCG/0.5ML IM SUSR
INTRAMUSCULAR | 1 refills | Status: DC
  Filled 2021-06-07: qty 1, 1d supply, fill #0

## 2021-06-14 ENCOUNTER — Ambulatory Visit (INDEPENDENT_AMBULATORY_CARE_PROVIDER_SITE_OTHER): Payer: No Typology Code available for payment source | Admitting: Orthopaedic Surgery

## 2021-06-14 ENCOUNTER — Other Ambulatory Visit (HOSPITAL_BASED_OUTPATIENT_CLINIC_OR_DEPARTMENT_OTHER): Payer: Self-pay | Admitting: Orthopaedic Surgery

## 2021-06-14 ENCOUNTER — Ambulatory Visit (HOSPITAL_BASED_OUTPATIENT_CLINIC_OR_DEPARTMENT_OTHER)
Admission: RE | Admit: 2021-06-14 | Discharge: 2021-06-14 | Disposition: A | Discharge status: Home / Self Care | Payer: No Typology Code available for payment source | Source: Ambulatory Visit | Attending: Orthopaedic Surgery | Admitting: Orthopaedic Surgery

## 2021-06-14 DIAGNOSIS — M25512 Pain in left shoulder: Secondary | ICD-10-CM

## 2021-06-14 DIAGNOSIS — G8929 Other chronic pain: Secondary | ICD-10-CM | POA: Diagnosis not present

## 2021-06-14 DIAGNOSIS — G2589 Other specified extrapyramidal and movement disorders: Secondary | ICD-10-CM

## 2021-06-14 MED ORDER — TRIAMCINOLONE ACETONIDE 40 MG/ML IJ SUSP
80.0000 mg | INTRAMUSCULAR | Status: AC | PRN
  Administered 2021-06-14: 80 mg via INTRA_ARTICULAR

## 2021-06-14 MED ORDER — LIDOCAINE HCL 1 % IJ SOLN
4.0000 mL | INTRAMUSCULAR | Status: AC | PRN
  Administered 2021-06-14: 4 mL

## 2021-06-14 NOTE — Progress Notes (Signed)
? ?                            ? ? ?Chief Complaint: Left shoulder pain ?  ? ? ?History of Present Illness:  ? ? ?Hannah Galloway is a 56 y.o. female presents with 1 year of left shoulder pain which has been going on and off.  This would periodically up and cause significant pain.  She has been using lidocaine patches of the medial aspect of the shoulder blade.  She has seen a chiropractor which helps transiently.  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  Denies any history of injections or physical therapy. ? ? ? ?Surgical History:   ?none ? ?PMH/PSH/Family History/Social History/Meds/Allergies:   ? ?Past Medical History:  ?Diagnosis Date  ?? Ascending aortic aneurysm   ?? Bilateral carpal tunnel syndrome 08/09/2018  ?? Glaucoma   ? ?Past Surgical History:  ?Procedure Laterality Date  ?? BACK SURGERY    ?? SPINE SURGERY  July 2010  ?? TONSILLECTOMY AND ADENOIDECTOMY  1974  ? ?Social History  ? ?Socioeconomic History  ?? Marital status: Married  ?  Spouse name: Not on file  ?? Number of children: 2  ?? Years of education: Not on file  ?? Highest education level: Not on file  ?Occupational History  ?  Comment: Lab   ?Tobacco Use  ?? Smoking status: Never  ?? Smokeless tobacco: Never  ?Vaping Use  ?? Vaping Use: Never used  ?Substance and Sexual Activity  ?? Alcohol use: No  ?? Drug use: Never  ?? Sexual activity: Yes  ?  Birth control/protection: Post-menopausal  ?Other Topics Concern  ?? Not on file  ?Social History Narrative  ?? Not on file  ? ?Social Determinants of Health  ? ?Financial Resource Strain: Not on file  ?Food Insecurity: Not on file  ?Transportation Needs: Not on file  ?Physical Activity: Not on file  ?Stress: Not on file  ?Social Connections: Not on file  ? ?Family History  ?Problem Relation Age of Onset  ?? Stroke Father   ?? Heart disease Father   ?? Diabetes Father   ?     type ll  ?? Heart attack Father   ?? Hypertension Father   ?? Hypertension Mother   ?? Arthritis Mother   ?? Asthma  Mother   ?? Lymphoma Brother 65  ?     t-call  ?? Cancer Brother   ?     T cell lymphoma  ? ?Allergies  ?Allergen Reactions  ?? Quinolones   ?  Patient was warned about not using Cipro and similar antibiotics. ?Recent studies have raised concern that fluoroquinolone antibiotics could be associated with an increased risk of aortic aneurysm ?Fluoroquinolones have non-antimicrobial properties that might jeopardise the integrity of the extracellular matrix of the vascular wall ?In a  propensity score matched cohort study in [place], there was a 66% increased rate of aortic aneurysm or dissection associated with oral fluoroquinolone use, compared wit  ? ?Current Outpatient Medications  ?Medication Sig Dispense Refill  ?? Multiple Vitamins-Minerals (MULTIVITAMIN ADULTS 50+ PO) Take 1 tablet by mouth daily.    ?? Zoster Vaccine Adjuvanted [facility]) injection Inject into the muscle. 1 each 1  ? ?No current facility-administered medications for this visit.  ? ?No results found. ? ?Review of Systems:   ?A ROS was performed including pertinent positives and negatives as documented in the HPI. ? ?Physical  Exam :   ?Constitutional: NAD and appears stated age ?Neurological: Alert and oriented ?Psych: Appropriate affect and cooperative ?There were no vitals taken for this visit.  ? ?Comprehensive Musculoskeletal Exam:   ? ?Musculoskeletal Exam    ?Inspection Right Left  ?Skin No atrophy or winging No atrophy or winging  ?Palpation    ?Tenderness none Medial scapula  ?Range of Motion    ?Flexion (passive) 170 170  ?Flexion (active) 170 170  ?Abduction 170 170  ?ER at the side 70 70  ?Can reach behind back to T12 T12  ?Strength    ? Full Full  ?Special Tests    ?Pseudoparalytic No No  ?Neurologic    ?Fires PIN, radial, median, ulnar, musculocutaneous, axillary, suprascapular, long thoracic, and spinal accessory innervated muscles. No abnormal sensibility  ?Vascular/Lymphatic    ?Radial Pulse 2+ 2+  ?Cervical Exam    ?Patient has  symmetric cervical range of motion with negative Spurling's test.  ?Special Test: Positive leaning on left shoulder  ? ? ? ?Imaging:   ?Xray (3 views left shoulder): ?Normal ? ? ?I personally reviewed and interpreted the radiographs. ? ? ?Assessment:   ?56 y.o. female with left shoulder scapular dyskinesis.  I spent a long time explaining the fundamental cause of this.  We did discuss that at this time she is experiencing symptoms consistent with bursitis under the shoulder blade.  This fact I recommended ultrasound-guided injection of the bursa sac.  She would like to proceed with this.  I will also plan to refer her for physical therapy for a scapular dyskinesis program and possible some trapezial dry needling. ? ?Plan :   ? ?-Left subscapular ultrasound-guided injection performed after verbal consent obtained from ? ? ? ?Procedure Note ? ?Patient: Hannah Galloway             ?Date of Birth: April 14, 1965           ?MRN: [id number]             ?Visit Date: 06/14/2021 ? ?Procedures: ?Visit Diagnoses:  ?1. Chronic left shoulder pain   ? ? ?Large Joint Inj on 06/14/2021 11:43 AM ?Indications: pain ?Details: 22 G 1.5 in needle, ultrasound-guided anterior approach ? ?Arthrogram: No ? ?Medications: 4 mL lidocaine 1 %; 80 mg triamcinolone acetonide 40 MG/ML ?Outcome: tolerated well, no immediate complications ?Procedure, treatment alternatives, risks and benefits explained, specific risks discussed. Consent was given by the patient. Immediately prior to procedure a time out was called to verify the correct patient, procedure, equipment, support staff and site/side marked as required. Patient was prepped and draped in the usual sterile fashion.  ? ? ? ? ? ? ? ? ?I personally saw and evaluated the patient, and participated in the management and treatment plan. ? ?Vanetta Mulders, MD ?Attending Physician, Orthopedic Surgery ? ?This document was dictated using Systems analyst. A reasonable attempt at proof reading  has been made to minimize errors. ?

## 2021-06-24 ENCOUNTER — Encounter (HOSPITAL_BASED_OUTPATIENT_CLINIC_OR_DEPARTMENT_OTHER): Payer: Self-pay | Admitting: Orthopaedic Surgery

## 2021-06-25 ENCOUNTER — Encounter (HOSPITAL_BASED_OUTPATIENT_CLINIC_OR_DEPARTMENT_OTHER): Payer: Self-pay | Admitting: Nurse Practitioner

## 2021-06-27 ENCOUNTER — Ambulatory Visit (INDEPENDENT_AMBULATORY_CARE_PROVIDER_SITE_OTHER): Payer: No Typology Code available for payment source | Admitting: Orthopaedic Surgery

## 2021-06-27 DIAGNOSIS — G2589 Other specified extrapyramidal and movement disorders: Secondary | ICD-10-CM | POA: Diagnosis not present

## 2021-06-27 NOTE — Progress Notes (Signed)
? ?                            ? ? ?Chief Complaint: Left shoulder pain ?  ? ? ?History of Present Illness:  ? ?06/27/2021: Hannah Galloway presents today as she has had quite limited abduction after massage.  States that she did not have a specific injury other than undergoing massage earlier this week. ? ?Hannah Galloway is a 56 y.o. female presents with 1 year of left shoulder pain which has been going on and off.  This would periodically up and cause significant pain.  She has been using lidocaine patches of the medial aspect of the shoulder blade.  She has seen a chiropractor which helps transiently.  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  Denies any history of injections or physical therapy. ? ? ? ?Surgical History:   ?none ? ?PMH/PSH/Family History/Social History/Meds/Allergies:   ? ?Past Medical History:  ?Diagnosis Date  ? Ascending aortic aneurysm   ? Bilateral carpal tunnel syndrome 08/09/2018  ? Glaucoma   ? ?Past Surgical History:  ?Procedure Laterality Date  ? BACK SURGERY    ? SPINE SURGERY  July 2010  ? TONSILLECTOMY AND ADENOIDECTOMY  1974  ? ?Social History  ? ?Socioeconomic History  ? Marital status: Married  ?  Spouse name: Not on file  ? Number of children: 2  ? Years of education: Not on file  ? Highest education level: Not on file  ?Occupational History  ?  Comment: Lab   ?Tobacco Use  ? Smoking status: Never  ? Smokeless tobacco: Never  ?Vaping Use  ? Vaping Use: Never used  ?Substance and Sexual Activity  ? Alcohol use: No  ? Drug use: Never  ? Sexual activity: Yes  ?  Birth control/protection: Post-menopausal  ?Other Topics Concern  ? Not on file  ?Social History Narrative  ? Not on file  ? ?Social Determinants of Health  ? ?Financial Resource Strain: Not on file  ?Food Insecurity: Not on file  ?Transportation Needs: Not on file  ?Physical Activity: Not on file  ?Stress: Not on file  ?Social Connections: Not on file  ? ?Family History  ?Problem Relation Age of Onset  ? Stroke Father   ?  Heart disease Father   ? Diabetes Father   ?     type ll  ? Heart attack Father   ? Hypertension Father   ? Hypertension Mother   ? Arthritis Mother   ? Asthma Mother   ? Lymphoma Brother 29  ?     t-call  ? Cancer Brother   ?     T cell lymphoma  ? ?Allergies  ?Allergen Reactions  ? Quinolones   ?  Patient was warned about not using Cipro and similar antibiotics. ?Recent studies have raised concern that fluoroquinolone antibiotics could be associated with an increased risk of aortic aneurysm ?Fluoroquinolones have non-antimicrobial properties that might jeopardise the integrity of the extracellular matrix of the vascular wall ?In a  propensity score matched cohort study in [place], there was a 66% increased rate of aortic aneurysm or dissection associated with oral fluoroquinolone use, compared wit  ? ?Current Outpatient Medications  ?Medication Sig Dispense Refill  ? Multiple Vitamins-Minerals (MULTIVITAMIN ADULTS 50+ PO) Take 1 tablet by mouth daily.    ? Zoster Vaccine Adjuvanted [facility]) injection Inject into the muscle. 1 each 1  ? ?No current facility-administered medications  for this visit.  ? ?No results found. ? ?Review of Systems:   ?A ROS was performed including pertinent positives and negatives as documented in the HPI. ? ?Physical Exam :   ?Constitutional: NAD and appears stated age ?Neurological: Alert and oriented ?Psych: Appropriate affect and cooperative ?There were no vitals taken for this visit.  ? ?Comprehensive Musculoskeletal Exam:   ? ?Musculoskeletal Exam    ?Inspection Right Left  ?Skin No atrophy or winging No atrophy or winging  ?Palpation    ?Tenderness none Medial scapula  ?Range of Motion    ?Flexion (passive) 170 170  ?Flexion (active) 170 170  ?Abduction 170 80  ?ER at the side 70 70  ?Can reach behind back to T12 T12  ?Strength    ? Full Full  ?Special Tests    ?Pseudoparalytic No No  ?Neurologic    ?Fires PIN, radial, median, ulnar, musculocutaneous, axillary, suprascapular, long  thoracic, and spinal accessory innervated muscles. No abnormal sensibility  ?Vascular/Lymphatic    ?Radial Pulse 2+ 2+  ?Cervical Exam    ?Patient has symmetric cervical range of motion with negative Spurling's test.  ?Special Test: Positive leaning on left shoulder  ? ? ? ?Imaging:   ?Xray (3 views left shoulder): ?Normal ? ? ?I personally reviewed and interpreted the radiographs. ? ? ?Assessment:   ?56 y.o. female with left shoulder scapular dyskinesis.  She has limited abduction after recent massage.  At this time I would like her to take it easy for approximately 1 week.  She will begin physical therapy in a week.  I do believe she may benefit from a TENS unit to activate the serratus to help get her scapula elevated ? ?Plan :   ? ?-Return to clinic following physical therapy ? ? ? ? ?I personally saw and evaluated the patient, and participated in the management and treatment plan. ? ?Vanetta Mulders, MD ?Attending Physician, Orthopedic Surgery ? ?This document was dictated using Systems analyst. A reasonable attempt at proof reading has been made to minimize errors. ?

## 2021-07-05 ENCOUNTER — Other Ambulatory Visit (HOSPITAL_COMMUNITY): Payer: Self-pay

## 2021-07-08 ENCOUNTER — Ambulatory Visit (HOSPITAL_BASED_OUTPATIENT_CLINIC_OR_DEPARTMENT_OTHER): Payer: No Typology Code available for payment source | Attending: Orthopaedic Surgery | Admitting: Physical Therapy

## 2021-07-08 ENCOUNTER — Encounter (HOSPITAL_BASED_OUTPATIENT_CLINIC_OR_DEPARTMENT_OTHER): Payer: Self-pay | Admitting: Physical Therapy

## 2021-07-08 ENCOUNTER — Ambulatory Visit (HOSPITAL_BASED_OUTPATIENT_CLINIC_OR_DEPARTMENT_OTHER): Payer: Self-pay | Admitting: Physical Therapy

## 2021-07-08 DIAGNOSIS — G8929 Other chronic pain: Secondary | ICD-10-CM | POA: Insufficient documentation

## 2021-07-08 DIAGNOSIS — M25612 Stiffness of left shoulder, not elsewhere classified: Secondary | ICD-10-CM | POA: Diagnosis not present

## 2021-07-08 DIAGNOSIS — M6281 Muscle weakness (generalized): Secondary | ICD-10-CM | POA: Insufficient documentation

## 2021-07-08 DIAGNOSIS — M25512 Pain in left shoulder: Secondary | ICD-10-CM | POA: Insufficient documentation

## 2021-07-08 NOTE — Therapy (Signed)
?OUTPATIENT PHYSICAL THERAPY SHOULDER EVALUATION ? ? ?Patient Name: Hannah Galloway ?MRN: [id number] ?DOB:1965/05/30, 56 y.o., female ?Today's Date: 07/09/2021 ? ? PT End of Session - 07/08/21 1546   ? ? Visit Number 1   ? Number of Visits 12   ? Date for PT Re-Evaluation 08/19/21   ? PT Start Time 1884   ? PT Stop Time 1660   ? PT Time Calculation (min) 47 min   ? Activity Tolerance Patient tolerated treatment well   ? Behavior During Therapy [facility] for tasks assessed/performed   ? ?  ?  ? ?  ? ? ?Past Medical History:  ?Diagnosis Date  ? Ascending aortic aneurysm ([place])   ? Bilateral carpal tunnel syndrome 08/09/2018  ? Glaucoma   ? ?Past Surgical History:  ?Procedure Laterality Date  ? BACK SURGERY    ? SPINE SURGERY  July 2010  ? TONSILLECTOMY AND ADENOIDECTOMY  1974  ? ?Patient Active Problem List  ? Diagnosis Date Noted  ? Encounter for annual physical exam 03/08/2021  ? Plantar fasciitis 09/20/2019  ? Thoracic aortic aneurysm ([place]) 01/12/2019  ? Bilateral carpal tunnel syndrome 08/09/2018  ? ? ? ?REFERRING PROVIDER: Vanetta Mulders, MD ? ?REFERRING DIAG: M25.512,G89.29 (ICD-10-CM) - Chronic left shoulder pain  ? ?THERAPY DIAG:  ? Chronic Left Shoulder pain ? Muscle weakness (generalized) ? Stiffness of left shoulder, not elsewhere classified ? ?ONSET DATE: Approx May 2022 ; Exacerbated in February ? ?SUBJECTIVE:                                                                                                                                                                                     ? ?SUBJECTIVE STATEMENT: ?Pt has been having left shoulder pain for 1 year with no specific MOI.  Pt has been seeing a chiropractor which was helping.  She reports her pain worsening in the past 3 months and the chiropractor treatments started not helping.  She did fall off a 4-5 foot ladder onto her tailbone.  She didn't land on her shoulder.  Pt states she had a massage and felt better afterwards.  She states 4-5 days after  massage, she noticed limited abduction ROM.  Her pain did not worsen, she just had limited abd ROM.  She has been using lidocaine patches of the medial aspect of the shoulder blade. She has seen a chiropractor which helps transiently. MD note indicated Scapular dyskinesis program. ? ?Pt received a Left subscapular ultrasound-guided injection which pt reports provided no relief.  MD ordered PT for a scapular dyskinesis program and possible trapezial dry needling.   ? ?Pt limits her activities due to her L UE.  She compensates with increasing activity with R UE and avoids using L UE.  Pt has to have elbow propped when using the computer.  "It's starting to affect more and more things I do".  Pt unable to workout.  Pt wakes up every 2-3 hours. Pt has pain with reaching activities.  ? ?Aggravating Factors:  standing, holding a cell phone to ear, activity ?Easing factors:  lying down, advil, heat > ice, figure 8 brace ? ? ? ?PERTINENT HISTORY: ?Ascending thoracic aortic aneurysm--Pt reports having no restrictions, L4-L5 discecomty in 2009, Bilateral carpal tunnel syndrome with surgery on R in 08/09/2018 ? ? ? ?PAIN:  ?Are you having pain? Yes ?Intensity:  7/10 current, 9.5/10 worst, /10 best ?Location:  medial scapula, shoulder, post prox UE to elbow ? ?PRECAUTIONS: Other: Ascending thoracic aortic aneurysm, L4-5 discectomy ? ?WEIGHT BEARING RESTRICTIONS No ? ?FALLS:  ?Has patient fallen in last 6 months? Yes. Number of falls 1 on her tailbone from a ladder ? ? ? 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  ? ?PLOF: Independent; Pt was able to perform her normal reaching, work, and daily activities without pain or limitation.  Pt able to sleep without shoulder pain waking her up. ? ?PATIENT GOALS wants to be able to lift her arm out to the side, increase sleep without disturbance, to improve pain ? ?OBJECTIVE:  ? ?DIAGNOSTIC FINDINGS:  ?MD note indicated Xray was normal. ? ?X rays in Epic  indicated ?IMPRESSION: ?Mild peripheral glenoid degenerative osteophytosis. On axillary ?view, there is a small curvilinear lucency overlying the far ?anterior aspect of the glenoid, and it is difficult to exclude a ?small nondisplaced fracture in this region. ? ?PATIENT SURVEYS:  ?UEFI:  47/80 ? ?COGNITION: ? Overall cognitive status: Within functional limits for tasks assessed ? ?R hand dominant.  Pt wearing the figure 8 brace. ?    ? ?UPPER EXTREMITY ROM:  ? ?AROM/PROM Right ?07/09/2021 Left ?07/09/2021  ?Shoulder flexion 171 124 with pain/163  ?Shoulder scaption 170 70 with pain  ?Shoulder abduction 158 60 with pain/150  ?Shoulder adduction    ?Shoulder internal rotation 75 75  ?Shoulder external rotation 80 63/73  ?Elbow flexion    ?Elbow extension    ?Wrist flexion    ?Wrist extension    ?Wrist ulnar deviation    ?Wrist radial deviation    ?Wrist pronation    ?Wrist supination    ?(Blank rows = not tested) ? ?UPPER EXTREMITY MMT: ? ?MMT Right ?07/09/2021 Left ?07/09/2021  ?Shoulder flexion    ?Shoulder extension    ?Shoulder abduction    ?Shoulder adduction    ?Shoulder internal rotation 5/5 4/5  ?Shoulder external rotation 5/5 4-/5  ?Middle trapezius 4+/5 <3/5  ?Lower trapezius    ?Elbow flexion    ?Elbow extension    ?Wrist flexion    ?Wrist extension    ?Wrist ulnar deviation    ?Wrist radial deviation    ?Wrist pronation    ?Wrist supination    ?Grip strength (lbs)    ?(Blank rows = not tested) ? ?  ?TODAY'S TREATMENT:  ?Pt performed supine serratus punches 2x10, prone rows 2x10, prone extension 2x10, and scap retraction x 10 reps.  Pt received a HEP handout and was educated in correct form and appropriate frequency.  PT instructed pt she should not have pain with exercises and to not hold her breath with exercises.   ?See below for pt education.  ? ? ?PATIENT  EDUCATION: ?Education details: HEP, dx, rationale of exercises, POC, objective findings, and exercise form.  PT answered pt's questions.  ?Person  educated: Patient ?Education method: Explanation, Demonstration, Tactile cues, Verbal cues, and Handouts ?Education comprehension: verbalized understanding, returned demonstration, verbal cues required, tactile cues required, and needs further education ? ? ?HOME EXERCISE PROGRAM: ?Access Code: [email or web address] ?URL: [email or web address] ?Date: 07/08/2021 ?Prepared by: Ronny Flurry ? ?Exercises ?- Single Arm Serratus Punches  - 1 x daily - 5-6 x weekly - 2-3 sets - 10 reps ?- Prone Shoulder Extension - Single Arm  - 1 x daily - 5-6 x weekly - 2 sets - 10 reps ?- Prone Shoulder Row  - 1 x daily - 5-6 x weekly - 2 sets - 10 reps ?- Seated Scapular Retraction  - 2-3 x daily - 7 x weekly - 1 sets - 10 reps ? ?ASSESSMENT: ? ?CLINICAL IMPRESSION: ?Patient is a 56 y.o. female with a dx of chronic L shoulder pain presenting to the clinic with L shoulder pain, muscle weakness in L UE, and limited AROM in L shoulder.  Pt received an US guided injection and reports no pain relief.  Pt limits her activities due to her L UE pain and uses her R UE more.  Pt uses computer a lot for work and has to have elbow propped.  Pt has pain with reaching activities and is unable to workout.  Pt reports having disturbed sleep every night and has pain in standing.  Pt has good PROM t/o L shoulder.  Pt should benefit from skilled PT services to address impairments and goals and to improve overall function.   ? ? ?OBJECTIVE IMPAIRMENTS decreased activity tolerance, decreased endurance, decreased ROM, decreased strength, impaired UE functional use, and pain.  ? ?ACTIVITY LIMITATIONS cleaning, meal prep, occupation, and shopping.  ? ?PERSONAL FACTORS Time since onset of injury/illness/exacerbation and 1-2 comorbidities: Ascending thoracic aortic aneurysm and L4-L5 discecomty in 2009  are also affecting patient's functional outcome.    ? ? ?REHAB POTENTIAL: Good ? ?CLINICAL DECISION MAKING: Stable/uncomplicated ? ?EVALUATION COMPLEXITY:  Low ? ? ?GOALS: ? ? ?SHORT TERM GOALS:  ? ?Pt will be independent and compliant with HEP for improved pain, strength, ROM, and function.  ?Baseline: ?Goal status: INITIAL ?Target date:  07/29/2021 ? ?2.  Pt will be abl

## 2021-07-09 ENCOUNTER — Ambulatory Visit (HOSPITAL_BASED_OUTPATIENT_CLINIC_OR_DEPARTMENT_OTHER): Payer: No Typology Code available for payment source | Admitting: Physical Therapy

## 2021-07-10 ENCOUNTER — Ambulatory Visit (HOSPITAL_BASED_OUTPATIENT_CLINIC_OR_DEPARTMENT_OTHER): Payer: No Typology Code available for payment source | Admitting: Physical Therapy

## 2021-07-10 ENCOUNTER — Encounter (HOSPITAL_BASED_OUTPATIENT_CLINIC_OR_DEPARTMENT_OTHER): Payer: Self-pay | Admitting: Physical Therapy

## 2021-07-10 DIAGNOSIS — G8929 Other chronic pain: Secondary | ICD-10-CM | POA: Diagnosis not present

## 2021-07-10 DIAGNOSIS — M25612 Stiffness of left shoulder, not elsewhere classified: Secondary | ICD-10-CM

## 2021-07-10 DIAGNOSIS — M6281 Muscle weakness (generalized): Secondary | ICD-10-CM

## 2021-07-10 NOTE — Therapy (Deleted)
Patient Name: Hannah Galloway MRN: [id number] DOB:07-30-1965, 56 y.o., female Today's Date: 07/09/2021   Visit #2,  Date for PT re-eval 08/19/21 Start: 1600 End :1640 Time: 46mn    REFERRING PROVIDER: BVanetta Mulders MD   REFERRING DIAG: M25.512,G89.29 (ICD-10-CM) - Chronic left shoulder pain    THERAPY DIAG:             Chronic Left Shoulder pain            Muscle weakness (generalized)            Stiffness of left shoulder, not elsewhere classified   ONSET DATE: Approx May 2022 ; Exacerbated in February   SUBJECTIVE:                                                                                                                                                                                       SUBJECTIVE STATEMENT: Pt reports continued pain in Lt shoulder.  No other changes. She has done her HEP in AM.  She has been using TENS unit and lidocaine patch for pain relief.      PERTINENT HISTORY: Ascending thoracic aortic aneurysm--Pt reports having no restrictions, L4-L5 discecomty in 2009, Bilateral carpal tunnel syndrome with surgery on R in 08/09/2018     PAIN:  Are you having pain? Yes Intensity:  7/10 current, 9.5/10 worst, /10 best Location:  medial scapula, shoulder, post prox UE to elbow   PRECAUTIONS: Other: Ascending thoracic aortic aneurysm, L4-5 discectomy   WEIGHT BEARING RESTRICTIONS No   FALLS:  Has patient fallen in last 6 months? Yes. 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    PLOF: Independent; Pt was able to perform her normal reaching, work, and daily activities without pain or limitation.  Pt able to sleep without shoulder pain waking her up.   PATIENT GOALS wants to be able to lift her arm out to the side, increase sleep without disturbance, to improve pain   OBJECTIVE:    DIAGNOSTIC FINDINGS:  MD note indicated Xray was normal.   X rays in Epic  indicated IMPRESSION: Mild peripheral glenoid degenerative osteophytosis. On axillary view, there is a small curvilinear lucency overlying the far anterior aspect of the glenoid, and it is difficult to exclude a small nondisplaced fracture in this region.   PATIENT SURVEYS:  UEFI:  47/80   COGNITION:           Overall cognitive status: Within functional limits for tasks assessed   R hand dominant.  Pt wearing the figure 8 brace.                                    UPPER EXTREMITY ROM:    AROM/PROM Right 07/09/2021 Left 07/09/2021  Shoulder flexion 171 124 with pain/163  Shoulder scaption 170 70 with pain  Shoulder abduction 158 60 with pain/150  Shoulder adduction      Shoulder internal rotation 75 75  Shoulder external rotation 80 63/73  Elbow flexion      Elbow extension      Wrist flexion      Wrist extension      Wrist ulnar deviation      Wrist radial deviation      Wrist pronation      Wrist supination      (Blank rows = not tested)   UPPER EXTREMITY MMT:   MMT Right 07/09/2021 Left 07/09/2021  Shoulder flexion      Shoulder extension      Shoulder abduction      Shoulder adduction      Shoulder internal rotation 5/5 4/5  Shoulder external rotation 5/5 4-/5  Middle trapezius 4+/5 <3/5  Lower trapezius      Elbow flexion      Elbow extension      Wrist flexion      Wrist extension      Wrist ulnar deviation      Wrist radial deviation      Wrist pronation      Wrist supination      Grip strength (lbs)      (Blank rows = not tested)               TODAY'S TREATMENT:  Self care: self massage to Lt pec, periscapular musculature with ball against wall Supine Lt pec stretch with partial arm off of table;  star gazer stretch (hands behind head) x 30 sec x 2  Prone: Lt shoulder ext x 10, Lt shoulder row x 10 - with tactile cues for scap retraction Sidelying: small range Lt shoulder abdct to 90 deg with eccentric lowering. Tactile cues for scap  depression/retraction Supine: Lt protraction x 10 Standing: scap squeeze against pool noodle x 5 sec x 5; L's x 5, W's x 8 Trial of pec stretch with elbow straight (40deg abdct) - very limited tolerance      PATIENT EDUCATION: Education details: HEP, dx, rationale of exercises, POC, objective findings, and exercise form.  PT answered pt's questions.  Person educated: Patient Education method: Explanation, Demonstration, Tactile cues, Verbal cues, and Handouts Education comprehension: verbalized understanding, returned demonstration, verbal cues required, tactile cues required, and needs further education     HOME EXERCISE PROGRAM: Access Code: [email or web address] URL: [email or web address] Date: 07/10/2021 Prepared by: [place]  Exercises - Single Arm Serratus Punches  - 1 x daily - 5-6 x weekly - 2-3 sets - 10 reps - Prone Shoulder Extension - Single Arm  - 1 x daily - 5-6 x weekly - 2 sets - 10 reps - Prone Shoulder Row  - 1 x daily - 5-6 x weekly - 2 sets - 10 reps - Seated Scapular Retraction  - 2-3 x daily - 7 x weekly - 1 sets - 10 reps  - Supine Chest Stretch with Elbows Bent  - 1 x daily - 7 x weekly - 2 reps - 30 sec hold -  Supine Single Arm Pectoralis Stretch  - 1 x daily - 7 x weekly - 2 reps - 30 seconds hold - L's  - 1 x daily - 7 x weekly - 1 sets - 5-10 reps - 3-5 seconds hold   ASSESSMENT:   CLINICAL IMPRESSION: Pt requires tactile cues for scap retraction in prone position.  She is able to get good retraction  and depression in sidelying and standing position.  Pt may benefit from STM to Lt pec major/minor, subscap as well as ktape into neutral scap position for increased proprioception. No change in pain during session.  Encouraged pt to utilize her TENS/ MHP at home for pain relief.  Pt should benefit from skilled PT services to address impairments and goals and to improve overall function.       OBJECTIVE IMPAIRMENTS  decreased activity tolerance, decreased endurance, decreased ROM, decreased strength, impaired UE functional use, and pain.    ACTIVITY LIMITATIONS cleaning, meal prep, occupation, and shopping.    PERSONAL FACTORS Time since onset of injury/illness/exacerbation and 1-2 comorbidities: Ascending thoracic aortic aneurysm and L4-L5 discecomty in 2009  are also affecting patient's functional outcome.        REHAB POTENTIAL: Good   CLINICAL DECISION MAKING: Stable/uncomplicated   EVALUATION COMPLEXITY: Low     GOALS:     SHORT TERM GOALS:    Pt will be independent and compliant with HEP for improved pain, strength, ROM, and function.  Baseline: Goal status: INITIAL Target date:  07/29/2021   2.  Pt will be able to hold a cell phone to her ear without increased pain or difficulty.  Baseline:  Goal status: INITIAL Target date:  07/29/2021   3.  Pt will demo at least a 20 deg improvement in flexion AROM and 30 deg improvement in scaption and abduction AROM for improved reaching and performance of ADLs.  Baseline:  Goal status: INITIAL Target date:  07/29/2021   4.  Pt will report at least a 25% improvement in pain and sx's overall. Baseline:  Goal status: INITIAL Target date:  07/29/2021         LONG TERM GOALS:    Pt will demo L shoulder AROM to be w/n 10 deg of R shoulder for improved performance of ADLs and IADLs.  Baseline:  Goal status: INITIAL Target date: 08/19/2021   2.  Pt will be able to sleep at least 5/7 nights/wk without pain disturbance.   Baseline:  Goal status: INITIAL Target date: 08/19/2021   3.  Pt will be able to perform her ADLs and IADLs with L UE without significant pain or limitations.  Baseline:  Goal status: INITIAL Target date: 08/19/2021   4.  Pt will demo improved L shoulder and scap strength to = R for improved functional lifting/carrying and performance of daily activities.  Baseline:  Goal status: INITIAL Target date: 08/19/2021        PLAN: PT FREQUENCY: 2x/week   PT DURATION: 6 weeks   PLANNED INTERVENTIONS: Therapeutic exercises, Therapeutic activity, Neuromuscular re-education, Patient/Family education, Joint mobilization, Aquatic Therapy, Dry Needling, Electrical stimulation, Cryotherapy, Moist heat, Taping, Ultrasound, Manual therapy, and Re-evaluation   PLAN FOR NEXT SESSION: review and perform HEP.  Scapular and RTC strengthening and stability.  Perform supine rhythmic stab's next Rx. (Check end of session in note to ensure carry over from flow sheet).   Kerin Perna, PTA 07/10/21 4:53 PM

## 2021-07-10 NOTE — Therapy (Deleted)
  OUTPATIENT PHYSICAL THERAPY TREATMENT NOTE   Patient Name: Hannah Galloway MRN: [id number] DOB:01/02/1966, 56 y.o., female Today's Date: 07/10/2021  PCP: Marland Kitchen REFERRING PROVIDER: ***  END OF SESSION:   PT End of Session - 07/10/21 1559     Visit Number 2    Number of Visits 12    Date for PT Re-Evaluation 08/19/21    PT Start Time 1600    PT Stop Time 1639    PT Time Calculation (min) 39 min    Activity Tolerance Patient tolerated treatment well    Behavior During Therapy [facility] for tasks assessed/performed             Past Medical History:  Diagnosis Date   Ascending aortic aneurysm ([place])    Bilateral carpal tunnel syndrome 08/09/2018   Glaucoma    Past Surgical History:  Procedure Laterality Date   [place]  July 2010   TONSILLECTOMY AND ADENOIDECTOMY  1974   Patient Active Problem List   Diagnosis Date Noted   Encounter for annual physical exam 03/08/2021   Plantar fasciitis 09/20/2019   Thoracic aortic aneurysm ([place]) 01/12/2019   Bilateral carpal tunnel syndrome 08/09/2018    REFERRING DIAG: ***  THERAPY DIAG:  Chronic left shoulder pain  Muscle weakness (generalized)  Stiffness of left shoulder, not elsewhere classified  PERTINENT HISTORY: ***  PRECAUTIONS: ***  SUBJECTIVE: ***  PAIN:  Are you having pain? {[id number]}   OBJECTIVE: (objective measures completed at initial evaluation unless otherwise dated)   (Copy Eval's Objective through Plan section here)   Shelbie Hutching 07/10/2021, 4:02 PM

## 2021-07-11 NOTE — Therapy (Signed)
OUTPATIENT PHYSICAL THERAPY TREATMENT NOTE   Patient Name: Hannah Galloway MRN: [id number] DOB:1966/01/28, 56 y.o., female Today's Date: 07/10/2021    END OF SESSION:   PT End of Session - 07/10/21 1559     Visit Number 2    Number of Visits 12    Date for PT Re-Evaluation 08/19/21    PT Start Time 1600    PT Stop Time 7782    PT Time Calculation (min) 39 min    Activity Tolerance Patient tolerated treatment well    Behavior During Therapy [facility] for tasks assessed/performed             Past Medical History:  Diagnosis Date   Ascending aortic aneurysm ([place])    Bilateral carpal tunnel syndrome 08/09/2018   Glaucoma    Past Surgical History:  Procedure Laterality Date   BACK SURGERY     SPINE SURGERY  July 2010   TONSILLECTOMY AND ADENOIDECTOMY  1974   Patient Active Problem List   Diagnosis Date Noted   Encounter for annual physical exam 03/08/2021   Plantar fasciitis 09/20/2019   Thoracic aortic aneurysm ([place]) 01/12/2019   Bilateral carpal tunnel syndrome 08/09/2018   REFERRING PROVIDER: Vanetta Mulders, MD   REFERRING DIAG: [phone] (ICD-10-CM) - Chronic left shoulder pain    THERAPY DIAG:             Chronic Left Shoulder pain            Muscle weakness (generalized)            Stiffness of left shoulder, not elsewhere classified   ONSET DATE: Approx May 2022 ; Exacerbated in February   SUBJECTIVE:                                                                                                                                                                                       SUBJECTIVE STATEMENT: Pt reports continued pain in Lt shoulder.  No other changes. She has done her HEP in AM.  She has been using TENS unit and lidocaine patch for pain relief.      PERTINENT HISTORY: Ascending thoracic aortic aneurysm--Pt reports having no restrictions, L4-L5 discecomty in 2009, Bilateral carpal tunnel syndrome with surgery on R in 08/09/2018     PAIN:  Are  you having pain? Yes Intensity:  7/10 current, 9.5/10 worst, /10 best Location:  medial scapula, shoulder, post prox UE to elbow   PRECAUTIONS: Other: Ascending thoracic aortic aneurysm, L4-5 discectomy   WEIGHT BEARING RESTRICTIONS No   FALLS:  Has patient fallen in last 6 months? Yes. 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    PLOF: Independent; Pt was able to perform her normal reaching, work, and daily activities without pain or limitation.  Pt able to sleep without shoulder pain waking her up.   PATIENT GOALS wants to be able to lift her arm out to the side, increase sleep without disturbance, to improve pain   OBJECTIVE:    DIAGNOSTIC FINDINGS:  MD note indicated Xray was normal.   X rays in Epic indicated IMPRESSION: Mild peripheral glenoid degenerative osteophytosis. On axillary view, there is a small curvilinear lucency overlying the far anterior aspect of the glenoid, and it is difficult to exclude a small nondisplaced fracture in this region.   PATIENT SURVEYS:  UEFI:  47/80   COGNITION:           Overall cognitive status: Within functional limits for tasks assessed   R hand dominant.  Pt wearing the figure 8 brace.                                    UPPER EXTREMITY ROM:    AROM/PROM Right 07/09/2021 Left 07/09/2021  Shoulder flexion 171 124 with pain/163  Shoulder scaption 170 70 with pain  Shoulder abduction 158 60 with pain/150  Shoulder adduction      Shoulder internal rotation 75 75  Shoulder external rotation 80 63/73  Elbow flexion      Elbow extension      Wrist flexion      Wrist extension      Wrist ulnar deviation      Wrist radial deviation      Wrist pronation      Wrist supination      (Blank rows = not tested)   UPPER EXTREMITY MMT:   MMT Right 07/09/2021 Left 07/09/2021  Shoulder flexion      Shoulder extension      Shoulder abduction       Shoulder adduction      Shoulder internal rotation 5/5 4/5  Shoulder external rotation 5/5 4-/5  Middle trapezius 4+/5 <3/5  Lower trapezius      Elbow flexion      Elbow extension      Wrist flexion      Wrist extension      Wrist ulnar deviation      Wrist radial deviation      Wrist pronation      Wrist supination      Grip strength (lbs)      (Blank rows = not tested)               TODAY'S TREATMENT:  Self care: self massage to Lt pec, periscapular musculature with ball against wall Supine Lt pec stretch with partial arm off of table;  star gazer stretch (hands behind head) x 30 sec x 2   Prone: Lt shoulder ext x 10, Lt shoulder row x 10 - with tactile cues for scap retraction Sidelying: small range Lt shoulder abdct to 90 deg with eccentric lowering. Tactile cues for scap depression/retraction Supine: Lt protraction x 10 Standing: scap squeeze against pool noodle x 5 sec x 5; L's x 5, W's x 8 Trial of pec stretch with elbow straight (40deg abdct) - very limited tolerance      PATIENT EDUCATION: Education details: HEP, dx, rationale of exercises, POC, objective findings, and exercise form.  PT answered pt's questions.  Person educated: Patient Education method: Explanation, Demonstration, Tactile cues, Verbal cues, and Handouts Education comprehension: verbalized understanding, returned demonstration, verbal cues required, tactile cues required, and needs further education     HOME EXERCISE PROGRAM: Access Code: [email or web address] URL: [email or web address] Date: 07/10/2021 Prepared by: [facility] - Outpatient Rehab - [address]   Exercises - Single Arm Serratus Punches  - 1 x daily - 5-6 x weekly - 2-3 sets - 10 reps - Prone Shoulder Extension - Single Arm  - 1 x daily - 5-6 x weekly - 2 sets - 10 reps - Prone Shoulder Row  - 1 x daily - 5-6 x weekly - 2 sets - 10 reps - Seated Scapular Retraction  - 2-3 x daily - 7 x weekly - 1 sets - 10 reps   - Supine  Chest Stretch with Elbows Bent  - 1 x daily - 7 x weekly - 2 reps - 30 sec hold - Supine Single Arm Pectoralis Stretch  - 1 x daily - 7 x weekly - 2 reps - 30 seconds hold - L's  - 1 x daily - 7 x weekly - 1 sets - 5-10 reps - 3-5 seconds hold   ASSESSMENT:   CLINICAL IMPRESSION: Pt requires tactile cues for scap retraction in prone position.  She is able to get good retraction  and depression in sidelying and standing position.  Pt may benefit from STM to Lt pec major/minor, subscap as well as ktape into neutral scap position for increased proprioception. No change in pain during session.  Encouraged pt to utilize her TENS/ MHP at home for pain relief.  Pt should benefit from skilled PT services to address impairments and goals and to improve overall function.       OBJECTIVE IMPAIRMENTS decreased activity tolerance, decreased endurance, decreased ROM, decreased strength, impaired UE functional use, and pain.    ACTIVITY LIMITATIONS cleaning, meal prep, occupation, and shopping.    PERSONAL FACTORS Time since onset of injury/illness/exacerbation and 1-2 comorbidities: Ascending thoracic aortic aneurysm and L4-L5 discecomty in 2009  are also affecting patient's functional outcome.        REHAB POTENTIAL: Good   CLINICAL DECISION MAKING: Stable/uncomplicated   EVALUATION COMPLEXITY: Low     GOALS:     SHORT TERM GOALS:    Pt will be independent and compliant with HEP for improved pain, strength, ROM, and function.  Baseline: Goal status: INITIAL Target date:  07/29/2021   2.  Pt will be able to hold a cell phone to her ear without increased pain or difficulty.  Baseline:  Goal status: INITIAL Target date:  07/29/2021   3.  Pt will demo at least a 20 deg improvement in flexion AROM and 30 deg improvement in scaption and abduction AROM for improved reaching and performance of ADLs.  Baseline:  Goal status: INITIAL Target date:  07/29/2021   4.  Pt will report at least a 25%  improvement in pain and sx's overall. Baseline:  Goal status: INITIAL Target date:  07/29/2021         LONG TERM GOALS:    Pt will demo L shoulder AROM to be w/n 10 deg of R shoulder for improved performance of ADLs and IADLs.  Baseline:  Goal status: INITIAL Target date: 08/19/2021   2.  Pt will be able to sleep at least 5/7 nights/wk without pain disturbance.   Baseline:  Goal status: INITIAL Target date: 08/19/2021   3.  Pt  will be able to perform her ADLs and IADLs with L UE without significant pain or limitations.  Baseline:  Goal status: INITIAL Target date: 08/19/2021   4.  Pt will demo improved L shoulder and scap strength to = R for improved functional lifting/carrying and performance of daily activities.  Baseline:  Goal status: INITIAL Target date: 08/19/2021       PLAN: PT FREQUENCY: 2x/week   PT DURATION: 6 weeks   PLANNED INTERVENTIONS: Therapeutic exercises, Therapeutic activity, Neuromuscular re-education, Patient/Family education, Joint mobilization, Aquatic Therapy, Dry Needling, Electrical stimulation, Cryotherapy, Moist heat, Taping, Ultrasound, Manual therapy, and Re-evaluation   PLAN FOR NEXT SESSION: review and perform HEP.  Scapular and RTC strengthening and stability.  Perform supine rhythmic stab's next Rx. (Check end of session in note to ensure carry over from flow sheet).    Kerin Perna, PTA 07/10/21 4:53 PM

## 2021-07-24 ENCOUNTER — Other Ambulatory Visit (HOSPITAL_BASED_OUTPATIENT_CLINIC_OR_DEPARTMENT_OTHER): Payer: Self-pay | Admitting: Orthopaedic Surgery

## 2021-07-24 ENCOUNTER — Other Ambulatory Visit (HOSPITAL_COMMUNITY): Payer: Self-pay

## 2021-07-24 ENCOUNTER — Other Ambulatory Visit (HOSPITAL_BASED_OUTPATIENT_CLINIC_OR_DEPARTMENT_OTHER): Payer: Self-pay

## 2021-07-24 ENCOUNTER — Encounter (HOSPITAL_BASED_OUTPATIENT_CLINIC_OR_DEPARTMENT_OTHER): Payer: Self-pay | Admitting: Orthopaedic Surgery

## 2021-07-24 MED ORDER — METHYLPREDNISOLONE 4 MG PO TBPK
ORAL_TABLET | ORAL | 0 refills | Status: DC
  Filled 2021-07-24: qty 21, 6d supply, fill #0

## 2021-07-25 ENCOUNTER — Encounter (HOSPITAL_BASED_OUTPATIENT_CLINIC_OR_DEPARTMENT_OTHER): Payer: Self-pay | Admitting: Physical Therapy

## 2021-07-25 ENCOUNTER — Ambulatory Visit (HOSPITAL_BASED_OUTPATIENT_CLINIC_OR_DEPARTMENT_OTHER): Payer: No Typology Code available for payment source | Attending: Orthopaedic Surgery | Admitting: Physical Therapy

## 2021-07-25 DIAGNOSIS — M25512 Pain in left shoulder: Secondary | ICD-10-CM | POA: Insufficient documentation

## 2021-07-25 DIAGNOSIS — G8929 Other chronic pain: Secondary | ICD-10-CM | POA: Diagnosis present

## 2021-07-25 DIAGNOSIS — M25612 Stiffness of left shoulder, not elsewhere classified: Secondary | ICD-10-CM | POA: Insufficient documentation

## 2021-07-25 DIAGNOSIS — M6281 Muscle weakness (generalized): Secondary | ICD-10-CM | POA: Insufficient documentation

## 2021-07-25 NOTE — Therapy (Signed)
OUTPATIENT PHYSICAL THERAPY TREATMENT NOTE   Patient Name: Hannah Galloway MRN: [id number] DOB:03/01/1965, 56 y.o., female Today's Date: 07/10/2021     PT End of Session - 07/25/21 1354     Visit Number 3    Number of Visits 12    Date for PT Re-Evaluation 08/19/21    PT Start Time 1320    PT Stop Time 1353   limited tolerance to activity   PT Time Calculation (min) 33 min    Activity Tolerance Patient tolerated treatment well    Behavior During Therapy [facility] for tasks assessed/performed              Past Medical History:  Diagnosis Date   Ascending aortic aneurysm ([place])    Bilateral carpal tunnel syndrome 08/09/2018   Glaucoma    Past Surgical History:  Procedure Laterality Date   [place]  July 2010   TONSILLECTOMY AND ADENOIDECTOMY  1974   Patient Active Problem List   Diagnosis Date Noted   Encounter for annual physical exam 03/08/2021   Plantar fasciitis 09/20/2019   Thoracic aortic aneurysm ([place]) 01/12/2019   Bilateral carpal tunnel syndrome 08/09/2018   REFERRING PROVIDER: Vanetta Mulders, MD   REFERRING DIAG: [phone] (ICD-10-CM) - Chronic left shoulder pain    THERAPY DIAG:             Chronic Left Shoulder pain            Muscle weakness (generalized)            Stiffness of left shoulder, not elsewhere classified   ONSET DATE: Approx May 2022 ; Exacerbated in February   SUBJECTIVE:                                                                                                                                                                                       SUBJECTIVE STATEMENT: Pt reports continued pain in Lt shoulder.  No other changes. She has done her HEP in AM.  She has been using TENS unit and lidocaine patch for pain relief.      PERTINENT HISTORY: Ascending thoracic aortic aneurysm--Pt reports having no restrictions, L4-L5 discecomty in 2009, Bilateral carpal tunnel syndrome with surgery on R in 08/09/2018      PAIN:  PAIN:  Are you having pain? Yes VAS scale: 7/10 Pain location: neck extending down into her shoulder blade pain extending down into her biceps area  Pain orientation: Left  PAIN TYPE: aching Pain description: constant  Aggravating factors: use of her left arm  Relieving factors: keeping her shoulder propped.     PRECAUTIONS: Other: Ascending  thoracic aortic aneurysm, L4-5 discectomy   WEIGHT BEARING RESTRICTIONS No   FALLS:  Has patient fallen in last 6 months? Yes. 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    PLOF: Independent; Pt was able to perform her normal reaching, work, and daily activities without pain or limitation.  Pt able to sleep without shoulder pain waking her up.   PATIENT GOALS wants to be able to lift her arm out to the side, increase sleep without disturbance, to improve pain   OBJECTIVE:    DIAGNOSTIC FINDINGS:  MD note indicated Xray was normal.   X rays in Epic indicated IMPRESSION: Mild peripheral glenoid degenerative osteophytosis. On axillary view, there is a small curvilinear lucency overlying the far anterior aspect of the glenoid, and it is difficult to exclude a small nondisplaced fracture in this region.   PATIENT SURVEYS:  UEFI:  47/80   COGNITION:           Overall cognitive status: Within functional limits for tasks assessed   R hand dominant.  Pt wearing the figure 8 brace.                                    UPPER EXTREMITY ROM:    AROM/PROM Right 07/09/2021 Left 07/09/2021  Shoulder flexion 171 124 with pain/163  Shoulder scaption 170 70 with pain  Shoulder abduction 158 60 with pain/150  Shoulder adduction      Shoulder internal rotation 75 75  Shoulder external rotation 80 63/73  Elbow flexion      Elbow extension      Wrist flexion      Wrist extension      Wrist ulnar deviation      Wrist radial deviation      Wrist  pronation      Wrist supination      (Blank rows = not tested)   UPPER EXTREMITY MMT:   MMT Right 07/09/2021 Left 07/09/2021  Shoulder flexion      Shoulder extension      Shoulder abduction      Shoulder adduction      Shoulder internal rotation 5/5 4/5  Shoulder external rotation 5/5 4-/5  Middle trapezius 4+/5 <3/5  Lower trapezius      Elbow flexion      Elbow extension      Wrist flexion      Wrist extension      Wrist ulnar deviation      Wrist radial deviation      Wrist pronation      Wrist supination      Grip strength (lbs)      (Blank rows = not tested)               TODAY'S TREATMENT:  6/1 Manual: trigger point release to the upper trap and cervical spine in supine; sub-occipital release;  Seated trigger point release using IASTYM into the shoulder blade and upper trap.   Last visit:  Self care: self massage to Lt pec, periscapular musculature with ball against wall Supine Lt pec stretch with partial arm off of table;  star gazer stretch (hands behind head) x 30 sec x 2   Prone: Lt shoulder ext x 10, Lt shoulder row x 10 - with tactile cues for  scap retraction Sidelying: small range Lt shoulder abdct to 90 deg with eccentric lowering. Tactile cues for scap depression/retraction Supine: Lt protraction x 10 Standing: scap squeeze against pool noodle x 5 sec x 5; L's x 5, W's x 8 Trial of pec stretch with elbow straight (40deg abdct) - very limited tolerance      PATIENT EDUCATION: Education details: HEP, dx, rationale of exercises, POC, objective findings, and exercise form.  PT answered pt's questions.  Person educated: Patient Education method: Explanation, Demonstration, Tactile cues, Verbal cues, and Handouts Education comprehension: verbalized understanding, returned demonstration, verbal cues required, tactile cues required, and needs further education     HOME EXERCISE PROGRAM: Access Code: [email or web address] URL: [email or web address] Date:  07/10/2021 Prepared by: [facility] - Outpatient Rehab - [address]   Exercises - Single Arm Serratus Punches  - 1 x daily - 5-6 x weekly - 2-3 sets - 10 reps - Prone Shoulder Extension - Single Arm  - 1 x daily - 5-6 x weekly - 2 sets - 10 reps - Prone Shoulder Row  - 1 x daily - 5-6 x weekly - 2 sets - 10 reps - Seated Scapular Retraction  - 2-3 x daily - 7 x weekly - 1 sets - 10 reps   - Supine Chest Stretch with Elbows Bent  - 1 x daily - 7 x weekly - 2 reps - 30 sec hold - Supine Single Arm Pectoralis Stretch  - 1 x daily - 7 x weekly - 2 reps - 30 seconds hold - L's  - 1 x daily - 7 x weekly - 1 sets - 5-10 reps - 3-5 seconds hold   ASSESSMENT:   CLINICAL IMPRESSION: Patient has significant spasming and pain in the neck and into the shoulder blade. She was able to tolerate soft tissue mobilization but it did not improve her symptoms much. She felt like the main area of tenderness was near her scapula.    OBJECTIVE IMPAIRMENTS decreased activity tolerance, decreased endurance, decreased ROM, decreased strength, impaired UE functional use, and pain.    ACTIVITY LIMITATIONS cleaning, meal prep, occupation, and shopping.    PERSONAL FACTORS Time since onset of injury/illness/exacerbation and 1-2 comorbidities: Ascending thoracic aortic aneurysm and L4-L5 discecomty in 2009  are also affecting patient's functional outcome.        REHAB POTENTIAL: Good   CLINICAL DECISION MAKING: Stable/uncomplicated   EVALUATION COMPLEXITY: Low     GOALS:     SHORT TERM GOALS:    Pt will be independent and compliant with HEP for improved pain, strength, ROM, and function.  Baseline: Goal status: INITIAL Target date:  07/29/2021   2.  Pt will be able to hold a cell phone to her ear without increased pain or difficulty.  Baseline:  Goal status: INITIAL Target date:  07/29/2021   3.  Pt will demo at least a 20 deg improvement in flexion AROM and 30 deg improvement in scaption and abduction  AROM for improved reaching and performance of ADLs.  Baseline:  Goal status: INITIAL Target date:  07/29/2021   4.  Pt will report at least a 25% improvement in pain and sx's overall. Baseline:  Goal status: INITIAL Target date:  07/29/2021         LONG TERM GOALS:    Pt will demo L shoulder AROM to be w/n 10 deg of R shoulder for improved performance of ADLs and IADLs.  Baseline:  Goal status: INITIAL Target date: 08/19/2021   2.  Pt will be able to sleep at least 5/7 nights/wk without pain disturbance.   Baseline:  Goal status: INITIAL Target date: 08/19/2021   3.  Pt will be able to perform her ADLs and IADLs with L UE without significant pain or limitations.  Baseline:  Goal status: INITIAL Target date: 08/19/2021   4.  Pt will demo improved L shoulder and scap strength to = R for improved functional lifting/carrying and performance of daily activities.  Baseline:  Goal status: INITIAL Target date: 08/19/2021       PLAN: PT FREQUENCY: 2x/week   PT DURATION: 6 weeks   PLANNED INTERVENTIONS: Therapeutic exercises, Therapeutic activity, Neuromuscular re-education, Patient/Family education, Joint mobilization, Aquatic Therapy, Dry Needling, Electrical stimulation, Cryotherapy, Moist heat, Taping, Ultrasound, Manual therapy, and Re-evaluation   PLAN FOR NEXT SESSION: review and perform HEP.  Scapular and RTC strengthening and stability.  Perform supine rhythmic stab's next Rx. (Check end of session in note to ensure carry over from flow sheet).    Kerin Perna, PTA 07/10/21 4:53 PM

## 2021-07-26 ENCOUNTER — Encounter (HOSPITAL_BASED_OUTPATIENT_CLINIC_OR_DEPARTMENT_OTHER): Payer: Self-pay | Admitting: Physical Therapy

## 2021-07-28 NOTE — Therapy (Signed)
OUTPATIENT PHYSICAL THERAPY TREATMENT NOTE   Patient Name: Hannah Galloway MRN: [id number] DOB:12-10-1965, 56 y.o., female Today's Date: 07/10/2021        Past Medical History:  Diagnosis Date   Ascending aortic aneurysm [facility])    Bilateral carpal tunnel syndrome 08/09/2018   Glaucoma    Past Surgical History:  Procedure Laterality Date   BACK SURGERY     SPINE SURGERY  July 2010   TONSILLECTOMY AND ADENOIDECTOMY  1974   Patient Active Problem List   Diagnosis Date Noted   Encounter for annual physical exam 03/08/2021   Plantar fasciitis 09/20/2019   Thoracic aortic aneurysm ([place]) 01/12/2019   Bilateral carpal tunnel syndrome 08/09/2018   REFERRING PROVIDER: Vanetta Mulders, MD   REFERRING DIAG: [phone] (ICD-10-CM) - Chronic left shoulder pain    THERAPY DIAG:             Chronic Left Shoulder pain            Muscle weakness (generalized)            Stiffness of left shoulder, not elsewhere classified   ONSET DATE: Approx May 2022 ; Exacerbated in February   SUBJECTIVE:                                                                                                                                                                                       SUBJECTIVE STATEMENT: Pt reports continued pain in Lt shoulder.  No other changes. She has done her HEP in AM.  She has been using TENS unit and lidocaine patch for pain relief.      PERTINENT HISTORY: Ascending thoracic aortic aneurysm--Pt reports having no restrictions, L4-L5 discecomty in 2009, Bilateral carpal tunnel syndrome with surgery on R in 08/09/2018     PAIN:  PAIN:  Are you having pain? Yes VAS scale: 7/10 Pain location: neck extending down into her shoulder blade pain extending down into her biceps area  Pain orientation: Left  PAIN TYPE: aching Pain description: constant  Aggravating factors: use of her left arm  Relieving factors: keeping her shoulder propped.     PRECAUTIONS: Other:  Ascending thoracic aortic aneurysm, L4-5 discectomy   WEIGHT BEARING RESTRICTIONS No   FALLS:  Has patient fallen in last 6 months? Yes. 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    PLOF: Independent; Pt was able to perform her normal reaching, work, and daily activities without pain or limitation.  Pt able to sleep without shoulder pain waking her up.  PATIENT GOALS wants to be able to lift her arm out to the side, increase sleep without disturbance, to improve pain   OBJECTIVE:    DIAGNOSTIC FINDINGS:  MD note indicated Xray was normal.   X rays in Epic indicated IMPRESSION: Mild peripheral glenoid degenerative osteophytosis. On axillary view, there is a small curvilinear lucency overlying the far anterior aspect of the glenoid, and it is difficult to exclude a small nondisplaced fracture in this region.   PATIENT SURVEYS:  UEFI:  47/80   COGNITION:           Overall cognitive status: Within functional limits for tasks assessed   R hand dominant.  Pt wearing the figure 8 brace.                                    UPPER EXTREMITY ROM:    AROM/PROM Right 07/09/2021 Left 07/09/2021  Shoulder flexion 171 124 with pain/163  Shoulder scaption 170 70 with pain  Shoulder abduction 158 60 with pain/150  Shoulder adduction      Shoulder internal rotation 75 75  Shoulder external rotation 80 63/73  Elbow flexion      Elbow extension      Wrist flexion      Wrist extension      Wrist ulnar deviation      Wrist radial deviation      Wrist pronation      Wrist supination      (Blank rows = not tested)   UPPER EXTREMITY MMT:   MMT Right 07/09/2021 Left 07/09/2021  Shoulder flexion      Shoulder extension      Shoulder abduction      Shoulder adduction      Shoulder internal rotation 5/5 4/5  Shoulder external rotation 5/5 4-/5  Middle trapezius 4+/5 <3/5  Lower trapezius       Elbow flexion      Elbow extension      Wrist flexion      Wrist extension      Wrist ulnar deviation      Wrist radial deviation      Wrist pronation      Wrist supination      Grip strength (lbs)      (Blank rows = not tested)               TODAY'S TREATMENT:  6/1 Manual: trigger point release to the upper trap and cervical spine in supine; sub-occipital release;  Seated trigger point release using IASTYM into the shoulder blade and upper trap.   Last visit:  Self care: self massage to Lt pec, periscapular musculature with ball against wall Supine Lt pec stretch with partial arm off of table;  star gazer stretch (hands behind head) x 30 sec x 2   Prone: Lt shoulder ext x 10, Lt shoulder row x 10 - with tactile cues for scap retraction Sidelying: small range Lt shoulder abdct to 90 deg with eccentric lowering. Tactile cues for scap depression/retraction Supine: Lt protraction x 10 Standing: scap squeeze against pool noodle x 5 sec x 5; L's x 5, W's x 8 Trial of pec stretch with elbow straight (40deg abdct) - very limited tolerance      PATIENT EDUCATION: Education details: HEP, dx, rationale of exercises, POC, objective findings, and exercise form.  PT answered pt's questions.  Person educated: Patient Education method: Explanation, Demonstration,  Tactile cues, Verbal cues, and Handouts Education comprehension: verbalized understanding, returned demonstration, verbal cues required, tactile cues required, and needs further education     HOME EXERCISE PROGRAM: Access Code: [email or web address] URL: [email or web address] Date: 07/10/2021 Prepared by: [facility] - Outpatient Rehab - [address]   Exercises - Single Arm Serratus Punches  - 1 x daily - 5-6 x weekly - 2-3 sets - 10 reps - Prone Shoulder Extension - Single Arm  - 1 x daily - 5-6 x weekly - 2 sets - 10 reps - Prone Shoulder Row  - 1 x daily - 5-6 x weekly - 2 sets - 10 reps - Seated Scapular Retraction  - 2-3  x daily - 7 x weekly - 1 sets - 10 reps   - Supine Chest Stretch with Elbows Bent  - 1 x daily - 7 x weekly - 2 reps - 30 sec hold - Supine Single Arm Pectoralis Stretch  - 1 x daily - 7 x weekly - 2 reps - 30 seconds hold - L's  - 1 x daily - 7 x weekly - 1 sets - 5-10 reps - 3-5 seconds hold   ASSESSMENT:   CLINICAL IMPRESSION: Patient has significant spasming and pain in the neck and into the shoulder blade. She was able to tolerate soft tissue mobilization but it did not improve her symptoms much. She felt like the main area of tenderness was near her scapula.    OBJECTIVE IMPAIRMENTS decreased activity tolerance, decreased endurance, decreased ROM, decreased strength, impaired UE functional use, and pain.    ACTIVITY LIMITATIONS cleaning, meal prep, occupation, and shopping.    PERSONAL FACTORS Time since onset of injury/illness/exacerbation and 1-2 comorbidities: Ascending thoracic aortic aneurysm and L4-L5 discecomty in 2009  are also affecting patient's functional outcome.        REHAB POTENTIAL: Good   CLINICAL DECISION MAKING: Stable/uncomplicated   EVALUATION COMPLEXITY: Low     GOALS:     SHORT TERM GOALS:    Pt will be independent and compliant with HEP for improved pain, strength, ROM, and function.  Baseline: Goal status: INITIAL Target date:  07/29/2021   2.  Pt will be able to hold a cell phone to her ear without increased pain or difficulty.  Baseline:  Goal status: INITIAL Target date:  07/29/2021   3.  Pt will demo at least a 20 deg improvement in flexion AROM and 30 deg improvement in scaption and abduction AROM for improved reaching and performance of ADLs.  Baseline:  Goal status: INITIAL Target date:  07/29/2021   4.  Pt will report at least a 25% improvement in pain and sx's overall. Baseline:  Goal status: INITIAL Target date:  07/29/2021         LONG TERM GOALS:    Pt will demo L shoulder AROM to be w/n 10 deg of R shoulder for improved  performance of ADLs and IADLs.  Baseline:  Goal status: INITIAL Target date: 08/19/2021   2.  Pt will be able to sleep at least 5/7 nights/wk without pain disturbance.   Baseline:  Goal status: INITIAL Target date: 08/19/2021   3.  Pt will be able to perform her ADLs and IADLs with L UE without significant pain or limitations.  Baseline:  Goal status: INITIAL Target date: 08/19/2021   4.  Pt will demo improved L shoulder and scap strength to = R for improved functional lifting/carrying and performance of daily activities.  Baseline:  Goal status: INITIAL Target  date: 08/19/2021       PLAN: PT FREQUENCY: 2x/week   PT DURATION: 6 weeks   PLANNED INTERVENTIONS: Therapeutic exercises, Therapeutic activity, Neuromuscular re-education, Patient/Family education, Joint mobilization, Aquatic Therapy, Dry Needling, Electrical stimulation, Cryotherapy, Moist heat, Taping, Ultrasound, Manual therapy, and Re-evaluation   PLAN FOR NEXT SESSION: review and perform HEP.  Scapular and RTC strengthening and stability.  Perform supine rhythmic stab's next Rx. (Check end of session in note to ensure carry over from flow sheet).    Kerin Perna, PTA 07/10/21 4:53 PM

## 2021-07-29 ENCOUNTER — Ambulatory Visit (HOSPITAL_BASED_OUTPATIENT_CLINIC_OR_DEPARTMENT_OTHER): Payer: No Typology Code available for payment source | Admitting: Physical Therapy

## 2021-07-29 ENCOUNTER — Encounter (HOSPITAL_BASED_OUTPATIENT_CLINIC_OR_DEPARTMENT_OTHER): Payer: Self-pay | Admitting: Physical Therapy

## 2021-07-29 DIAGNOSIS — M6281 Muscle weakness (generalized): Secondary | ICD-10-CM

## 2021-07-29 DIAGNOSIS — G8929 Other chronic pain: Secondary | ICD-10-CM

## 2021-07-29 DIAGNOSIS — M25612 Stiffness of left shoulder, not elsewhere classified: Secondary | ICD-10-CM

## 2021-07-29 DIAGNOSIS — M25512 Pain in left shoulder: Secondary | ICD-10-CM | POA: Diagnosis not present

## 2021-07-31 ENCOUNTER — Ambulatory Visit (HOSPITAL_BASED_OUTPATIENT_CLINIC_OR_DEPARTMENT_OTHER): Payer: No Typology Code available for payment source | Admitting: Physical Therapy

## 2021-07-31 ENCOUNTER — Encounter (HOSPITAL_BASED_OUTPATIENT_CLINIC_OR_DEPARTMENT_OTHER): Payer: Self-pay | Admitting: Physical Therapy

## 2021-07-31 DIAGNOSIS — M6281 Muscle weakness (generalized): Secondary | ICD-10-CM

## 2021-07-31 DIAGNOSIS — M25612 Stiffness of left shoulder, not elsewhere classified: Secondary | ICD-10-CM

## 2021-07-31 DIAGNOSIS — M25512 Pain in left shoulder: Secondary | ICD-10-CM | POA: Diagnosis not present

## 2021-07-31 DIAGNOSIS — G8929 Other chronic pain: Secondary | ICD-10-CM

## 2021-07-31 NOTE — Therapy (Signed)
OUTPATIENT PHYSICAL THERAPY TREATMENT NOTE   Patient Name: RADIAH LUBINSKI MRN: [id number] DOB:Jan 30, 1966, 56 y.o., female Today's Date: 07/10/2021     PT End of Session - 07/31/21 0835     Visit Number 5    Number of Visits 12    Date for PT Re-Evaluation 08/19/21    PT Start Time 0806    PT Stop Time 0849    PT Time Calculation (min) 43 min    Activity Tolerance Patient tolerated treatment well    Behavior During Therapy [facility] [address] for tasks assessed/performed                Past Medical History:  Diagnosis Date   Ascending aortic aneurysm ([place])    Bilateral carpal tunnel syndrome 08/09/2018   Glaucoma    Past Surgical History:  Procedure Laterality Date   BACK SURGERY     SPINE SURGERY  July 2010   TONSILLECTOMY AND ADENOIDECTOMY  1974   Patient Active Problem List   Diagnosis Date Noted   Encounter for annual physical exam 03/08/2021   Plantar fasciitis 09/20/2019   Thoracic aortic aneurysm ([place]) 01/12/2019   Bilateral carpal tunnel syndrome 08/09/2018   REFERRING PROVIDER: Vanetta Mulders, MD   REFERRING DIAG: [id number] (ICD-10-CM) - Chronic left shoulder pain    THERAPY DIAG:             Chronic Left Shoulder pain            Muscle weakness (generalized)            Stiffness of left shoulder, not elsewhere classified   ONSET DATE: Approx May 2022 ; Exacerbated in February   SUBJECTIVE:                                                                                                                                                                                       SUBJECTIVE STATEMENT: Pt states her pain was the worst the weekend before last.  Pt is not sure what caused it, but did use a foam roller and did some stretching on it prior to exacerbation.  Pt felt good while doing it, but not sure if that may have contributed to the pain.  She messaged MD and started prednisone on Wednesday.  Pt reports her pain began to improve on Friday and felt much  better on Saturday.  Pt reports she has been wearing the figure 8 brace which does help.  She states she has difficulty keeping her scapulas back in good posture.  Pt was performing her HEP and was feeling a little better prior to this exacerbation.  Pt has not been performing HEP since  the exacerbation.  Pt reports having pain with pec stretch.  She has been using TENS unit and lidocaine patch for pain relief.    Pt states she felt great after prior Rx.  She told her husband she had the best day in awhile.  Her pain then returned later that night.  Pt used the TENS unit Monday night and Tuesday and reports reduced pain while using it.  Pain woke her up at 3 AM.  Pt reports 4/10 pain in L post shoulder,UT, scapula, ant shoulder, pec, and proximal UE.    PERTINENT HISTORY: Ascending thoracic aortic aneurysm--Pt reports having no restrictions, L4-L5 discecomty in 2009, Bilateral carpal tunnel syndrome with surgery on R in 08/09/2018     PAIN:  Are you having pain? Yes NPRS scale: 4/10 Pain location: L post shoulder,UT, scapula, ant shoulder, pec, and proximal UE.   Pain orientation: Left  PAIN TYPE: aching Pain description: constant  Aggravating factors: use of her left arm  Relieving factors: keeping her shoulder propped.     PRECAUTIONS: Other: Ascending thoracic aortic aneurysm, L4-5 discectomy   WEIGHT BEARING RESTRICTIONS No   FALLS:  Has patient fallen in last 6 months? Yes. 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    PLOF: Independent; Pt was able to perform her normal reaching, work, and daily activities without pain or limitation.  Pt able to sleep without shoulder pain waking her up.   PATIENT GOALS wants to be able to lift her arm out to the side, increase sleep without disturbance, to improve pain   OBJECTIVE:    DIAGNOSTIC FINDINGS:  MD note indicated Xray was normal.   X rays in  Epic indicated IMPRESSION: Mild peripheral glenoid degenerative osteophytosis. On axillary view, there is a small curvilinear lucency overlying the far anterior aspect of the glenoid, and it is difficult to exclude a small nondisplaced fracture in this region.                TODAY'S TREATMENT:  Therapeutic Exercise: Reviewed pt presentation, pain level, HEP compliance, and response to prior Rx. Reviewed HEP.   Pt performed:  Supine serratus punches with 1# 2x10  Supine shoulder ABC x 1 reps Prone ext 2x10 with 1#  Prone Row 2x10 with 1#  S/L ER x10,5 reps with 0# and x10,5 reps with 1#  Prone horizontal abduction 2x10  Supine rhythmic stabs 3x30 sec at 90 deg flexion  Manual Therapy:   Pt received STM with trigger point release and IASTM to L UT and medial scap mm seated to improve soft tissue tightness, reduce myofascial restrictions and adhesions, and improve pain. Educated and demonstrated usage of theracane to medial scap mm and UT.  Pt performed in the clinic and reports improved sx's.        PATIENT EDUCATION: Education details:  PT provided education using theracane at home for STW.   HEP, rationale of exercises, POC, and exercise form.  PT answered pt's questions.  Person educated: Patient Education method: Explanation, Demonstration, Tactile cues, Verbal cues Education comprehension: verbalized understanding, returned demonstration, verbal cues required, tactile cues required, and needs further education     HOME EXERCISE PROGRAM: Access Code: [email or web address] URL: [email or web address] Date: 07/10/2021 Prepared by: [facility] - Outpatient Rehab - [address]   Exercises - Single Arm Serratus Punches  - 1 x daily - 5-6 x weekly -  2-3 sets - 10 reps - Prone Shoulder Extension - Single Arm  - 1 x daily - 5-6 x weekly - 2 sets - 10 reps - Prone Shoulder Row  - 1 x daily - 5-6 x weekly - 2 sets - 10 reps - Seated Scapular Retraction  - 2-3 x daily - 7 x weekly  - 1 sets - 10 reps   - Supine Chest Stretch with Elbows Bent  - 1 x daily - 7 x weekly - 2 reps - 30 sec hold - Supine Single Arm Pectoralis Stretch  - 1 x daily - 7 x weekly - 2 reps - 30 seconds hold - L's  - 1 x daily - 7 x weekly - 1 sets - 5-10 reps - 3-5 seconds hold   ASSESSMENT:   CLINICAL IMPRESSION: Pt had a good response to prior Rx though pain returned later that evening.  Pt has trigger points and tightness in UT and medial scap mm.  Pt responds well to STW reporting improved sx's.  PT educated and demonstrated usage of the theracane to improve soft tissue tightness to medial scap mm and UT.  Pt used theracane and reports improved sx's and wants to get one.  PT increased intensity of exercises with adding resistance to exercises and adding prone hz abd.  Pt performed exercises well with cuing and instruction for correct form.  She tolerated exercises well having no c/o's.  Pt responded well to Rx having no increased pain after Rx.       OBJECTIVE IMPAIRMENTS decreased activity tolerance, decreased endurance, decreased ROM, decreased strength, impaired UE functional use, and pain.    ACTIVITY LIMITATIONS cleaning, meal prep, occupation, and shopping.    PERSONAL FACTORS Time since onset of injury/illness/exacerbation and 1-2 comorbidities: Ascending thoracic aortic aneurysm and L4-L5 discecomty in 2009  are also affecting patient's functional outcome.        REHAB POTENTIAL: Good   CLINICAL DECISION MAKING: Stable/uncomplicated   EVALUATION COMPLEXITY: Low     GOALS:     SHORT TERM GOALS:    Pt will be independent and compliant with HEP for improved pain, strength, ROM, and function.  Baseline: Goal status: INITIAL Target date:  07/29/2021   2.  Pt will be able to hold a cell phone to her ear without increased pain or difficulty.  Baseline:  Goal status: INITIAL Target date:  07/29/2021   3.  Pt will demo at least a 20 deg improvement in flexion AROM and 30 deg  improvement in scaption and abduction AROM for improved reaching and performance of ADLs.  Baseline:  Goal status: INITIAL Target date:  07/29/2021   4.  Pt will report at least a 25% improvement in pain and sx's overall. Baseline:  Goal status: INITIAL Target date:  07/29/2021         LONG TERM GOALS:    Pt will demo L shoulder AROM to be w/n 10 deg of R shoulder for improved performance of ADLs and IADLs.  Baseline:  Goal status: INITIAL Target date: 08/19/2021   2.  Pt will be able to sleep at least 5/7 nights/wk without pain disturbance.   Baseline:  Goal status: INITIAL Target date: 08/19/2021   3.  Pt will be able to perform her ADLs and IADLs with L UE without significant pain or limitations.  Baseline:  Goal status: INITIAL Target date: 08/19/2021   4.  Pt will demo improved L shoulder and scap strength to = R for  improved functional lifting/carrying and performance of daily activities.  Baseline:  Goal status: INITIAL Target date: 08/19/2021       PLAN: PT FREQUENCY: 2x/week   PT DURATION: 6 weeks   PLANNED INTERVENTIONS: Therapeutic exercises, Therapeutic activity, Neuromuscular re-education, Patient/Family education, Joint mobilization, Aquatic Therapy, Dry Needling, Electrical stimulation, Cryotherapy, Moist heat, Taping, Ultrasound, Manual therapy, and Re-evaluation   PLAN FOR NEXT SESSION: Assess pt's response and sx's.  Cont with ther ex, proprio, and MT pending pt response and tolerance.  Review theracane.  Scapular and RTC strengthening and stability.      Selinda Michaels III PT, DPT 07/31/21 11:46 PM

## 2021-08-06 ENCOUNTER — Encounter (HOSPITAL_BASED_OUTPATIENT_CLINIC_OR_DEPARTMENT_OTHER): Payer: No Typology Code available for payment source | Admitting: Physical Therapy

## 2021-08-08 NOTE — Therapy (Signed)
OUTPATIENT PHYSICAL THERAPY TREATMENT NOTE   Patient Name: ARIS STRELOW MRN: [id number] DOB:20-Nov-1965, 56 y.o., female Today's Date: 07/10/2021     PT End of Session - 08/09/21 0846     Visit Number 6    Number of Visits 12    Date for PT Re-Evaluation 08/19/21    PT Start Time 0845    PT Stop Time 0929    PT Time Calculation (min) 44 min    Activity Tolerance Patient tolerated treatment well    Behavior During Therapy [facility] for tasks assessed/performed                 Past Medical History:  Diagnosis Date   Ascending aortic aneurysm (HCC)    Bilateral carpal tunnel syndrome 08/09/2018   Glaucoma    Past Surgical History:  Procedure Laterality Date   BACK SURGERY     SPINE SURGERY  July 2010   TONSILLECTOMY AND ADENOIDECTOMY  1974   Patient Active Problem List   Diagnosis Date Noted   Encounter for annual physical exam 03/08/2021   Plantar fasciitis 09/20/2019   Thoracic aortic aneurysm (HCC) 01/12/2019   Bilateral carpal tunnel syndrome 08/09/2018   REFERRING PROVIDER: Huel Cote, MD   REFERRING DIAG: [phone] (ICD-10-CM) - Chronic left shoulder pain    THERAPY DIAG:             Chronic Left Shoulder pain            Muscle weakness (generalized)            Stiffness of left shoulder, not elsewhere classified   ONSET DATE: Approx May 2022 ; Exacerbated in February   SUBJECTIVE:                                                                                                                                                                                       SUBJECTIVE STATEMENT: This has been my best week so far. Able to do the exercises without it hurting so much. Still cannot sleep on either side.    PERTINENT HISTORY: Ascending thoracic aortic aneurysm--Pt reports having no restrictions, L4-L5 discecomty in 2009, Bilateral carpal tunnel syndrome with surgery on R in 08/09/2018     PAIN:  Are you having pain? Yes NPRS scale: 2/10 Pain  location: L post shoulder,UT, scapula, ant shoulder, pec, and proximal UE.   Pain orientation: Left  PAIN TYPE: aching Pain description: constant  Aggravating factors: use of her left arm  Relieving factors: keeping her shoulder propped.     PRECAUTIONS: Other: Ascending thoracic aortic aneurysm, L4-5 discectomy   WEIGHT BEARING RESTRICTIONS No   FALLS:  Has  patient fallen in last 6 months? Yes. 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    PLOF: Independent; Pt was able to perform her normal reaching, work, and daily activities without pain or limitation.  Pt able to sleep without shoulder pain waking her up.   PATIENT GOALS wants to be able to lift her arm out to the side, increase sleep without disturbance, to improve pain   OBJECTIVE:    DIAGNOSTIC FINDINGS:  MD note indicated Xray was normal.   X rays in Epic indicated IMPRESSION: Mild peripheral glenoid degenerative osteophytosis. On axillary view, there is a small curvilinear lucency overlying the far anterior aspect of the glenoid, and it is difficult to exclude a small nondisplaced fracture in this region.                TODAY'S TREATMENT:  6/16:  TPDN with skilled palpation and monitoring followed by STM to the following muscles:Lt upper trap, infraspinatus Sidelying scapular retraction- resisted by PT SL ER & ABD with scapular guidance Ktape for retraction+depression          PATIENT EDUCATION: Education details:  Anatomy of condition, POC, HEP, exercise form/rationale. TPDN Person educated: Patient Education method: Explanation, Demonstration, Tactile cues, Verbal cues Education comprehension: verbalized understanding, returned demonstration, verbal cues required, tactile cues required, and needs further education     HOME EXERCISE PROGRAM: Access Code: [email or web address] URL:  [email or web address]  ASSESSMENT:   CLINICAL IMPRESSION: Significant twitch response in infraspinatus. Tends to overutilize GHJ ER to produce scap retraction which is creating impingement rather than decreasing winging.  Will drop in to next appointment to provide further DN if appropriate.       OBJECTIVE IMPAIRMENTS decreased activity tolerance, decreased endurance, decreased ROM, decreased strength, impaired UE functional use, and pain.    ACTIVITY LIMITATIONS cleaning, meal prep, occupation, and shopping.    PERSONAL FACTORS Time since onset of injury/illness/exacerbation and 1-2 comorbidities: Ascending thoracic aortic aneurysm and L4-L5 discecomty in 2009  are also affecting patient's functional outcome.        REHAB POTENTIAL: Good   CLINICAL DECISION MAKING: Stable/uncomplicated   EVALUATION COMPLEXITY: Low     GOALS:     SHORT TERM GOALS:    Pt will be independent and compliant with HEP for improved pain, strength, ROM, and function.  Baseline: Goal status: achieved Target date:  07/29/2021   2.  Pt will be able to hold a cell phone to her ear without increased pain or difficulty.  Baseline:  for a little while Goal status: partially met Target date:  07/29/2021   3.  Pt will demo at least a 20 deg improvement in flexion AROM and 30 deg improvement in scaption and abduction AROM for improved reaching and performance of ADLs.  Baseline: 6/16: flexion 144   abd 95 Goal status: achieved Target date:  07/29/2021   4.  Pt will report at least a 25% improvement in pain and sx's overall. Baseline:  Goal status: achieved Target date:  07/29/2021         LONG TERM GOALS:    Pt will demo L shoulder AROM to be w/n 10 deg of R shoulder for improved performance of ADLs and IADLs.  Baseline:  Goal status: INITIAL Target date: 08/19/2021   2.  Pt will be able to sleep at least 5/7 nights/wk without pain disturbance.  Baseline:  Goal status:  INITIAL Target date: 08/19/2021   3.  Pt will be able to perform her ADLs and IADLs with L UE without significant pain or limitations.  Baseline:  Goal status: INITIAL Target date: 08/19/2021   4.  Pt will demo improved L shoulder and scap strength to = R for improved functional lifting/carrying and performance of daily activities.  Baseline:  Goal status: INITIAL Target date: 08/19/2021       PLAN: PT FREQUENCY: 2x/week   PT DURATION: 6 weeks   PLANNED INTERVENTIONS: Therapeutic exercises, Therapeutic activity, Neuromuscular re-education, Patient/Family education, Joint mobilization, Aquatic Therapy, Dry Needling, Electrical stimulation, Cryotherapy, Moist heat, Taping, Ultrasound, Manual therapy, and Re-evaluation   PLAN FOR NEXT SESSION: Assess pt's response and sx's.  Cont with ther ex, proprio, and MT pending pt response and tolerance.  Review theracane.  Scapular and RTC strengthening and stability.      Vrinda Heckstall C. Taquilla Downum PT, DPT 08/09/21 11:52 AM

## 2021-08-09 ENCOUNTER — Ambulatory Visit (HOSPITAL_BASED_OUTPATIENT_CLINIC_OR_DEPARTMENT_OTHER): Payer: No Typology Code available for payment source | Admitting: Physical Therapy

## 2021-08-09 ENCOUNTER — Encounter (HOSPITAL_BASED_OUTPATIENT_CLINIC_OR_DEPARTMENT_OTHER): Payer: Self-pay | Admitting: Physical Therapy

## 2021-08-09 DIAGNOSIS — M6281 Muscle weakness (generalized): Secondary | ICD-10-CM

## 2021-08-09 DIAGNOSIS — M25512 Pain in left shoulder: Secondary | ICD-10-CM | POA: Diagnosis not present

## 2021-08-09 DIAGNOSIS — G8929 Other chronic pain: Secondary | ICD-10-CM

## 2021-08-09 DIAGNOSIS — M25612 Stiffness of left shoulder, not elsewhere classified: Secondary | ICD-10-CM

## 2021-08-12 ENCOUNTER — Ambulatory Visit (HOSPITAL_BASED_OUTPATIENT_CLINIC_OR_DEPARTMENT_OTHER): Payer: No Typology Code available for payment source | Admitting: Physical Therapy

## 2021-08-12 ENCOUNTER — Encounter (HOSPITAL_BASED_OUTPATIENT_CLINIC_OR_DEPARTMENT_OTHER): Payer: Self-pay | Admitting: Physical Therapy

## 2021-08-12 DIAGNOSIS — G8929 Other chronic pain: Secondary | ICD-10-CM

## 2021-08-12 DIAGNOSIS — M25512 Pain in left shoulder: Secondary | ICD-10-CM | POA: Diagnosis not present

## 2021-08-12 DIAGNOSIS — M25612 Stiffness of left shoulder, not elsewhere classified: Secondary | ICD-10-CM

## 2021-08-12 DIAGNOSIS — M6281 Muscle weakness (generalized): Secondary | ICD-10-CM

## 2021-08-12 NOTE — Therapy (Signed)
OUTPATIENT PHYSICAL THERAPY TREATMENT NOTE   Patient Name: Hannah Galloway MRN: [id number] DOB:[birth year], 56 y.o., female Today's Date: 07/10/2021     PT End of Session - 08/12/21 0853     Visit Number 7    Number of Visits 12    Date for PT Re-Evaluation 08/19/21    PT Start Time 0805    PT Stop Time 0847    PT Time Calculation (min) 42 min    Activity Tolerance Patient tolerated treatment well    Behavior During Therapy [facility] for tasks assessed/performed                  Past Medical History:  Diagnosis Date   Ascending aortic aneurysm ([place])    Bilateral carpal tunnel syndrome 08/09/2018   Glaucoma    Past Surgical History:  Procedure Laterality Date   BACK SURGERY     SPINE SURGERY  July 2010   TONSILLECTOMY AND ADENOIDECTOMY  1974   Patient Active Problem List   Diagnosis Date Noted   Encounter for annual physical exam 03/08/2021   Plantar fasciitis 09/20/2019   Thoracic aortic aneurysm ([place]) 01/12/2019   Bilateral carpal tunnel syndrome 08/09/2018   REFERRING PROVIDER: Vanetta Mulders, MD   REFERRING DIAG: [phone] (ICD-10-CM) - Chronic left shoulder pain    THERAPY DIAG:             Chronic Left Shoulder pain            Muscle weakness (generalized)            Stiffness of left shoulder, not elsewhere classified   ONSET DATE: Approx May 2022 ; Exacerbated in February   SUBJECTIVE:                                                                                                                                                                                       SUBJECTIVE STATEMENT: "I feel the best I've felt in a long time".  Pt states she felt very good after prior Rx.  Pt reports having a good release and improved mobility after dry needling after prior Rx.  Still cannot sleep on either side.  Pt has N/T in L UE when lying on R UE.  She reports having some N/T in L hand currently.  Pt reports compliance with HEP.  Pt purchased a theracane and  has been using it at home.  Pt reports improved sleeping.  Pt reports improved reaching with L UE with less pain.     PERTINENT HISTORY: Ascending thoracic aortic aneurysm--Pt reports having no restrictions, L4-L5 discecomty in 2009, Bilateral carpal tunnel syndrome with surgery on R in  08/09/2018     PAIN:  Are you having pain? Yes NPRS scale: 1/10 Pain location: L post shoulder,UT, superomedial scapula  Pain orientation: Left  PAIN TYPE: dull, sorenress Pain description: constant  Aggravating factors: use of her left arm  Relieving factors: keeping her shoulder propped.     PRECAUTIONS: Other: Ascending thoracic aortic aneurysm, L4-5 discectomy   WEIGHT BEARING RESTRICTIONS No   FALLS:  Has patient fallen in last 6 months? Yes. 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    PLOF: Independent; Pt was able to perform her normal reaching, work, and daily activities without pain or limitation.  Pt able to sleep without shoulder pain waking her up.   PATIENT GOALS wants to be able to lift her arm out to the side, increase sleep without disturbance, to improve pain   OBJECTIVE:    DIAGNOSTIC FINDINGS:  MD note indicated Xray was normal.   X rays in Epic indicated IMPRESSION: Mild peripheral glenoid degenerative osteophytosis. On axillary view, there is a small curvilinear lucency overlying the far anterior aspect of the glenoid, and it is difficult to exclude a small nondisplaced fracture in this region.                TODAY'S TREATMENT:  Therapeutic Exercise:   Reviewed current function, pain level, response to prior Rx, and home management strategies. Pt performed repeated movements in cervical extension x 10 reps:  Pt had no change in N/T in L UE and reports some soreness in distal L UT. Pt performed:   Pt performed:             Supine shoulder ABC x 1 reps with 1# Prone ext 2x10  with 2#             Prone Row 2x10 with 2#             S/L ER 2x10 reps with 1#              Prone horizontal abduction 2x10             Supine rhythmic stabs x30 sec at 60/90/120 deg flexion  4D ball rolls on wall x10 reps each   Manual Therapy:   Pt received STM to L UT and medial scap mm to improve pain and reduce myofascial tightness and adhesions          PATIENT EDUCATION: Education details:  Anatomy of condition, POC, HEP, exercise form/rationale. TPDN Person educated: Patient Education method: Explanation, Demonstration, Tactile cues, Verbal cues Education comprehension: verbalized understanding, returned demonstration, verbal cues required, tactile cues required, and needs further education     HOME EXERCISE PROGRAM: Access Code: [email or web address] URL: [email or web address]  ASSESSMENT:   CLINICAL IMPRESSION: Pt has improved pain and sx's and is responding well to Rx.  She reports improved sleeping and reaching.  Pt reports having N/T in L UE and had no change in L UE N/T with cervical extension repeated movements.  Pt continues to have soft tissue tightness in cervical and scapular mm though is improving.  PT increased resistance with select exercises and she performed exercises well without c/o's.  Pt responded well to Rx having no increased pain though was sore after Rx.     OBJECTIVE IMPAIRMENTS decreased activity tolerance, decreased endurance, decreased ROM, decreased strength, impaired UE functional use, and pain.  ACTIVITY LIMITATIONS cleaning, meal prep, occupation, and shopping.    PERSONAL FACTORS Time since onset of injury/illness/exacerbation and 1-2 comorbidities: Ascending thoracic aortic aneurysm and L4-L5 discecomty in 2009  are also affecting patient's functional outcome.        REHAB POTENTIAL: Good   CLINICAL DECISION MAKING: Stable/uncomplicated   EVALUATION COMPLEXITY: Low     GOALS:     SHORT TERM GOALS:    Pt will be  independent and compliant with HEP for improved pain, strength, ROM, and function.  Baseline: Goal status: achieved Target date:  07/29/2021   2.  Pt will be able to hold a cell phone to her ear without increased pain or difficulty.  Baseline:  for a little while Goal status: partially met Target date:  07/29/2021   3.  Pt will demo at least a 20 deg improvement in flexion AROM and 30 deg improvement in scaption and abduction AROM for improved reaching and performance of ADLs.  Baseline: 6/16: flexion 144   abd 95 Goal status: achieved Target date:  07/29/2021   4.  Pt will report at least a 25% improvement in pain and sx's overall. Baseline:  Goal status: achieved Target date:  07/29/2021         LONG TERM GOALS:    Pt will demo L shoulder AROM to be w/n 10 deg of R shoulder for improved performance of ADLs and IADLs.  Baseline:  Goal status: INITIAL Target date: 08/19/2021   2.  Pt will be able to sleep at least 5/7 nights/wk without pain disturbance.   Baseline:  Goal status: INITIAL Target date: 08/19/2021   3.  Pt will be able to perform her ADLs and IADLs with L UE without significant pain or limitations.  Baseline:  Goal status: INITIAL Target date: 08/19/2021   4.  Pt will demo improved L shoulder and scap strength to = R for improved functional lifting/carrying and performance of daily activities.  Baseline:  Goal status: INITIAL Target date: 08/19/2021       PLAN: PT FREQUENCY: 2x/week   PT DURATION: 6 weeks   PLANNED INTERVENTIONS: Therapeutic exercises, Therapeutic activity, Neuromuscular re-education, Patient/Family education, Joint mobilization, Aquatic Therapy, Dry Needling, Electrical stimulation, Cryotherapy, Moist heat, Taping, Ultrasound, Manual therapy, and Re-evaluation   PLAN FOR NEXT SESSION: Assess pt's response and sx's.  Cont with ther ex, proprio, and MT pending pt response and tolerance.  Review theracane.  Scapular and RTC strengthening and  stability.      Selinda Michaels III PT, DPT 08/12/21 12:29 PM

## 2021-08-14 ENCOUNTER — Encounter (HOSPITAL_BASED_OUTPATIENT_CLINIC_OR_DEPARTMENT_OTHER): Payer: Self-pay | Admitting: Physical Therapy

## 2021-08-14 ENCOUNTER — Ambulatory Visit (HOSPITAL_BASED_OUTPATIENT_CLINIC_OR_DEPARTMENT_OTHER): Payer: No Typology Code available for payment source | Admitting: Physical Therapy

## 2021-08-14 DIAGNOSIS — G8929 Other chronic pain: Secondary | ICD-10-CM

## 2021-08-14 DIAGNOSIS — M25612 Stiffness of left shoulder, not elsewhere classified: Secondary | ICD-10-CM

## 2021-08-14 DIAGNOSIS — M6281 Muscle weakness (generalized): Secondary | ICD-10-CM

## 2021-08-14 DIAGNOSIS — M25512 Pain in left shoulder: Secondary | ICD-10-CM | POA: Diagnosis not present

## 2021-08-14 NOTE — Therapy (Signed)
OUTPATIENT PHYSICAL THERAPY TREATMENT NOTE   Patient Name: Hannah Galloway MRN: [id number] DOB:29-Aug-1965, 56 y.o., female Today's Date: 07/10/2021     PT End of Session - 08/14/21 0838     Visit Number 8    Number of Visits 12    Date for PT Re-Evaluation 08/19/21    PT Start Time 0800    PT Stop Time 0845    PT Time Calculation (min) 45 min    Activity Tolerance Patient tolerated treatment well    Behavior During Therapy [facility] for tasks assessed/performed                   Past Medical History:  Diagnosis Date   Ascending aortic aneurysm ([place])    Bilateral carpal tunnel syndrome 08/09/2018   Glaucoma    Past Surgical History:  Procedure Laterality Date   BACK SURGERY     SPINE SURGERY  July 2010   TONSILLECTOMY AND ADENOIDECTOMY  1974   Patient Active Problem List   Diagnosis Date Noted   Encounter for annual physical exam 03/08/2021   Plantar fasciitis 09/20/2019   Thoracic aortic aneurysm ([place]) 01/12/2019   Bilateral carpal tunnel syndrome 08/09/2018   REFERRING PROVIDER: Vanetta Mulders, MD   REFERRING DIAG: [phone] (ICD-10-CM) - Chronic left shoulder pain    THERAPY DIAG:             Chronic Left Shoulder pain            Muscle weakness (generalized)            Stiffness of left shoulder, not elsewhere classified   ONSET DATE: Approx May 2022 ; Exacerbated in February   SUBJECTIVE:                                                                                                                                                                                       SUBJECTIVE STATEMENT: Pt denies any adverse effects after prior Rx just a little soreness.  Pt reports improved lateral reaching including reaching out to check badge to get into parking lot at work, turn light switch on, and reaching into the refrigerator.  Pt states she was unable to perform these activities, but now able to.  Pt unable to lie on L side and did lay on her R side for 15  mins though didn't feel good.  Pt has N/T in L UE when lying on R UE.  She reports having some N/T in L hand currently.  Pt reports compliance with HEP.  Pt purchased a theracane and has been using it at home.  Pt reports improved sleeping.  Pt reports improved  reaching with L UE with less pain.  Pt states her pain improves as the day goes on as she moves it more.    PERTINENT HISTORY: Ascending thoracic aortic aneurysm--Pt reports having no restrictions, L4-L5 discecomty in 2009, Bilateral carpal tunnel syndrome with surgery on R in 08/09/2018     PAIN:  Are you having pain? Yes NPRS scale: 1/10 Pain location: L UT, superomedial scapula  Pain orientation: Left  PAIN TYPE: dull, sorenress Pain description: intermittent Aggravating factors: use of her left arm  Relieving factors: keeping her shoulder propped.     PRECAUTIONS: Other: Ascending thoracic aortic aneurysm, L4-5 discectomy   WEIGHT BEARING RESTRICTIONS No   FALLS:  Has patient fallen in last 6 months? Yes. 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    PLOF: Independent; Pt was able to perform her normal reaching, work, and daily activities without pain or limitation.  Pt able to sleep without shoulder pain waking her up.   PATIENT GOALS wants to be able to lift her arm out to the side, increase sleep without disturbance, to improve pain   OBJECTIVE:    DIAGNOSTIC FINDINGS:  MD note indicated Xray was normal.   X rays in Epic indicated IMPRESSION: Mild peripheral glenoid degenerative osteophytosis. On axillary view, there is a small curvilinear lucency overlying the far anterior aspect of the glenoid, and it is difficult to exclude a small nondisplaced fracture in this region.                TODAY'S TREATMENT:  Therapeutic Exercise:   Reviewed current function, pain level, response to prior Rx, and home management  strategies. Pt performed:   Pt performed:             UBE x 4 mins             Supine shoulder ABC x 1 reps with 1# Prone ext 2x10 with 2#             Prone Row 2x10 with 2#             S/L ER 2x10 reps with 1#              Prone horizontal abduction 2x10 with 1# and 1x10 without wt             Neuro Re-ed Activities:  Supine rhythmic stabs x30 sec at 60/90/120 deg flexion  4D ball rolls on wall 2x10 reps each            Serratus plus with hands on wall 2x10 reps  Manual Therapy:   Pt received STM to L UT, posterior cuff, and medial scap mm and IASTM to L UT and medial scap mm to improve pain and reduce myofascial tightness and adhesions.       PATIENT EDUCATION: Education details:  Anatomy of condition, POC, HEP, exercise form/rationale. Person educated: Patient Education method: Explanation, Demonstration, Tactile cues, Verbal cues Education comprehension: verbalized understanding, returned demonstration, verbal cues required, tactile cues required, and needs further education     HOME EXERCISE PROGRAM: Access Code: [email or web address] URL: [email or web address]  ASSESSMENT:   CLINICAL IMPRESSION: Pt is making progress with pain and sx's.  She is also improving with R shoulder and scapular strength as evidenced by performance of exercises.  Pt continues to have soft tissue tightness in medial scap mm and UT  though is improving.  Pt has improved soft tissue tightness with STM/IASTM.  She has moderate tenderness with STM to post cuff.  Pt responded well to Rx reporting no increased pain after Rx.  Pt should continue to benefit from cont skilled PT services to address ongoing goals and impairments and to restore PLOF.    OBJECTIVE IMPAIRMENTS decreased activity tolerance, decreased endurance, decreased ROM, decreased strength, impaired UE functional use, and pain.    ACTIVITY LIMITATIONS cleaning, meal prep, occupation, and shopping.    PERSONAL FACTORS Time since onset of  injury/illness/exacerbation and 1-2 comorbidities: Ascending thoracic aortic aneurysm and L4-L5 discecomty in 2009  are also affecting patient's functional outcome.        REHAB POTENTIAL: Good   CLINICAL DECISION MAKING: Stable/uncomplicated   EVALUATION COMPLEXITY: Low     GOALS:     SHORT TERM GOALS:    Pt will be independent and compliant with HEP for improved pain, strength, ROM, and function.  Baseline: Goal status: achieved Target date:  07/29/2021   2.  Pt will be able to hold a cell phone to her ear without increased pain or difficulty.  Baseline:  for a little while Goal status: partially met Target date:  07/29/2021   3.  Pt will demo at least a 20 deg improvement in flexion AROM and 30 deg improvement in scaption and abduction AROM for improved reaching and performance of ADLs.  Baseline: 6/16: flexion 144   abd 95 Goal status: achieved Target date:  07/29/2021   4.  Pt will report at least a 25% improvement in pain and sx's overall. Baseline:  Goal status: achieved Target date:  07/29/2021         LONG TERM GOALS:    Pt will demo L shoulder AROM to be w/n 10 deg of R shoulder for improved performance of ADLs and IADLs.  Baseline:  Goal status: INITIAL Target date: 08/19/2021   2.  Pt will be able to sleep at least 5/7 nights/wk without pain disturbance.   Baseline:  Goal status: INITIAL Target date: 08/19/2021   3.  Pt will be able to perform her ADLs and IADLs with L UE without significant pain or limitations.  Baseline:  Goal status: INITIAL Target date: 08/19/2021   4.  Pt will demo improved L shoulder and scap strength to = R for improved functional lifting/carrying and performance of daily activities.  Baseline:  Goal status: INITIAL Target date: 08/19/2021       PLAN: PT FREQUENCY: 2x/week   PT DURATION: 6 weeks   PLANNED INTERVENTIONS: Therapeutic exercises, Therapeutic activity, Neuromuscular re-education, Patient/Family education, Joint  mobilization, Aquatic Therapy, Dry Needling, Electrical stimulation, Cryotherapy, Moist heat, Taping, Ultrasound, Manual therapy, and Re-evaluation   PLAN FOR NEXT SESSION:   PN next visit.  Cont with ther ex, proprio, and MT pending pt response and tolerance.  Scapular and RTC strengthening and stability.      Selinda Michaels III PT, DPT 08/14/21 9:19 PM

## 2021-08-19 ENCOUNTER — Encounter (HOSPITAL_BASED_OUTPATIENT_CLINIC_OR_DEPARTMENT_OTHER): Payer: Self-pay | Admitting: Physical Therapy

## 2021-08-19 ENCOUNTER — Ambulatory Visit (HOSPITAL_BASED_OUTPATIENT_CLINIC_OR_DEPARTMENT_OTHER): Payer: No Typology Code available for payment source | Admitting: Physical Therapy

## 2021-08-19 DIAGNOSIS — G8929 Other chronic pain: Secondary | ICD-10-CM

## 2021-08-19 DIAGNOSIS — M25512 Pain in left shoulder: Secondary | ICD-10-CM | POA: Diagnosis not present

## 2021-08-19 DIAGNOSIS — M25612 Stiffness of left shoulder, not elsewhere classified: Secondary | ICD-10-CM

## 2021-08-19 DIAGNOSIS — M6281 Muscle weakness (generalized): Secondary | ICD-10-CM

## 2021-08-21 ENCOUNTER — Encounter (HOSPITAL_BASED_OUTPATIENT_CLINIC_OR_DEPARTMENT_OTHER): Payer: No Typology Code available for payment source | Admitting: Physical Therapy

## 2021-08-22 ENCOUNTER — Ambulatory Visit (HOSPITAL_BASED_OUTPATIENT_CLINIC_OR_DEPARTMENT_OTHER): Payer: No Typology Code available for payment source | Admitting: Physical Therapy

## 2021-08-22 ENCOUNTER — Encounter (HOSPITAL_BASED_OUTPATIENT_CLINIC_OR_DEPARTMENT_OTHER): Payer: Self-pay | Admitting: Physical Therapy

## 2021-08-22 DIAGNOSIS — G8929 Other chronic pain: Secondary | ICD-10-CM

## 2021-08-22 DIAGNOSIS — M25612 Stiffness of left shoulder, not elsewhere classified: Secondary | ICD-10-CM

## 2021-08-22 DIAGNOSIS — M6281 Muscle weakness (generalized): Secondary | ICD-10-CM

## 2021-08-22 DIAGNOSIS — M25512 Pain in left shoulder: Secondary | ICD-10-CM | POA: Diagnosis not present

## 2021-08-22 NOTE — Therapy (Signed)
OUTPATIENT PHYSICAL THERAPY TREATMENT NOTE    Patient Name: Hannah Galloway MRN: [id number] DOB:1965/11/07, 56 y.o., female Today's Date: 07/10/2021     PT End of Session - 08/22/21 0811     Visit Number 10    Number of Visits 15    Date for PT Re-Evaluation 09/16/21    PT Start Time 0805    PT Stop Time 0277    PT Time Calculation (min) 42 min    Activity Tolerance Patient tolerated treatment well    Behavior During Therapy [facility] for tasks assessed/performed                     Past Medical History:  Diagnosis Date   Ascending aortic aneurysm ([place])    Bilateral carpal tunnel syndrome 08/09/2018   Glaucoma    Past Surgical History:  Procedure Laterality Date   BACK SURGERY     SPINE SURGERY  July 2010   TONSILLECTOMY AND ADENOIDECTOMY  1974   Patient Active Problem List   Diagnosis Date Noted   Encounter for annual physical exam 03/08/2021   Plantar fasciitis 09/20/2019   Thoracic aortic aneurysm ([place]) 01/12/2019   Bilateral carpal tunnel syndrome 08/09/2018   REFERRING PROVIDER: Vanetta Mulders, MD   REFERRING DIAG: [phone] (ICD-10-CM) - Chronic left shoulder pain    THERAPY DIAG:             Chronic Left Shoulder pain            Muscle weakness (generalized)            Stiffness of left shoulder, not elsewhere classified   ONSET DATE: Approx May 2022 ; Exacerbated in February   SUBJECTIVE:                                                                                                                                                                                       SUBJECTIVE STATEMENT: Pt denies any adverse effects after prior Rx.  Pt reports her shoulder was tight this AM when she first woke up.  She has returned to doing the elliptical.  She performed the elliptical for 20 mins and used the Ue's for 10 mins this AM.  Pt had no pain and reports improved tightness.  Pt denies pain currently.      HOME MANAGEMENT STRATEGIES:  Pt reports  compliance with HEP.  wears Figure 8 brace some, but not as much.  FUNCTIONAL IMPROVEMENTS:   improved lateral reaching including reaching out to check badge to get into parking lot at work, turn light switch on, and reaching into the refrigerator.  Sleeping, able to lie on R  side 15-20 mins.  Less pain at workday, not having to use heating pad or TENS at work.  Not having to use lidocaine patch anymore.  Pt reports she is able to perform ADLs and IADLs with minimal pain without limitations.   FUNCTIONAL LIMITATIONS:  Sleeping, unable to lie on L side.  Pain and difficulty carrying her work supplies/books into work   PERTINENT HISTORY: Ascending thoracic aortic aneurysm--Pt reports having no restrictions, L4-L5 discecomty in 2009, Bilateral carpal tunnel syndrome with surgery on R in 08/09/2018     PAIN:  Are you having pain? No currently NPRS scale: 0/10 current, 2/10 Worst, 0/10 best Pain location: L UT, superomedial scapula, shoulder  Pain orientation: Left  PAIN TYPE: dull, soreness.  Pt states she still has tingling in hand.   Pain description: intermittent    PRECAUTIONS: Other: Ascending thoracic aortic aneurysm, L4-5 discectomy   WEIGHT BEARING RESTRICTIONS No   FALLS:  Has patient fallen in last 6 months? Yes. 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    PLOF: Independent; Pt was able to perform her normal reaching, work, and daily activities without pain or limitation.  Pt able to sleep without shoulder pain waking her up.   PATIENT GOALS wants to be able to lift her arm out to the side, increase sleep without disturbance, to improve pain   OBJECTIVE:    DIAGNOSTIC FINDINGS:  MD note indicated Xray was normal.   X rays in Epic indicated IMPRESSION: Mild peripheral glenoid degenerative osteophytosis. On axillary view, there is a small curvilinear lucency overlying the far anterior  aspect of the glenoid, and it is difficult to exclude a small nondisplaced fracture in this region.                TODAY'S TREATMENT:                                    Therapeutic Exercise:   Reviewed current function, pain level, response to prior Rx, and home management strategies. Pt performed:                        Standing ER/IR with arm at side with RTB 2x10 reps each   Jobe's flexion x 10 reps with 0# and 2x10 with 1#   Standing scaption x 10 reps with 0# and 2x10 with 1#   4D ball rolls at 90 deg flexion 2x10 with 1.1 lb   Protraction/retraction on wall 3x10 reps             Manual Therapy:   Pt received STM to L UT and medial scap mm and IASTM to L UT and medial scap mm to improve pain and reduce myofascial tightness and adhesions.       PATIENT EDUCATION: Education details:  Reviewed HEP and updated HEP.  Gave pt a HEP handout and educated in correct form and appropriate frequency.  Objective findings, relevant anatomy, POC, and exercise form/rationale. Person educated: Patient Education method: Explanation, Demonstration, Tactile cues, Verbal cues Education comprehension: verbalized understanding, returned demonstration, verbal cues required, tactile cues required, and needs further education     HOME EXERCISE PROGRAM: Access Code: [email or web address] URL: [email or web address]  Updated HEP: - Shoulder External Rotation with Anchored Resistance  - 1 x  daily - 4 x weekly - 2-3 sets - 10 reps - Shoulder Internal Rotation with Resistance  - 1 x daily - 4 x weekly - 2-3 sets - 10 reps  ASSESSMENT:   CLINICAL IMPRESSION: Pt is making great progress in pain and sx's.  She presents to Rx having no pain today.  Pt has significant weakness in L shoulder and scapula.  PT progressed exercises today to improve strength in L shoulder.  Pt performed exercises well without c/o's.  She continues to have a mm spasm in L UT though overall improved spasm and tightness.  Pt had  an appropriate response to IASTM.  Pt responded well to Rx having no pain after Rx.  Pt should continue to benefit from cont skilled PT services to address ongoing goals and impairments and to restore PLOF.   OBJECTIVE IMPAIRMENTS decreased activity tolerance, decreased endurance, decreased ROM, decreased strength, impaired UE functional use, and pain.    ACTIVITY LIMITATIONS cleaning, meal prep, occupation, and shopping.    PERSONAL FACTORS Time since onset of injury/illness/exacerbation and 1-2 comorbidities: Ascending thoracic aortic aneurysm and L4-L5 discecomty in 2009  are also affecting patient's functional outcome.        REHAB POTENTIAL: Good   CLINICAL DECISION MAKING: Stable/uncomplicated   EVALUATION COMPLEXITY: Low     GOALS:     SHORT TERM GOALS:    Pt will be independent and compliant with HEP for improved pain, strength, ROM, and function.  Baseline: Goal status: achieved Target date:  07/29/2021   2.  Pt will be able to hold a cell phone to her ear without increased pain or difficulty.  Baseline:   Goal status: haven't tried lately, but thinks she can Target date:  07/29/2021   3.  Pt will demo at least a 20 deg improvement in flexion AROM and 30 deg improvement in scaption and abduction AROM for improved reaching and performance of ADLs.  Baseline: 6/16: flexion 144   abd 95 Goal status: achieved Target date:  07/29/2021   4.  Pt will report at least a 25% improvement in pain and sx's overall. Baseline:  Goal status: achieved Target date:  07/29/2021         LONG TERM GOALS:    Pt will demo L shoulder AROM to be w/n 10 deg of R shoulder for improved performance of ADLs and IADLs.  Baseline:  Goal status: PROGRESSING Target date:  09/16/2021   2.  Pt will be able to sleep at least 5/7 nights/wk without pain disturbance.   Baseline:  Goal status: NOT MET Target date: 09/16/2021   3.  Pt will be able to perform her ADLs and IADLs with L UE without  significant pain or limitations.  Baseline:  Goal status: GOAL MET Target date: 08/19/2021   4.  Pt will demo improved L shoulder and scap strength to = R for improved functional lifting/carrying and performance of daily activities.  Baseline:  Goal status: NOT MET Target date: 09/16/2021  5.  Pt will report she is able to carry in her work supplies without significant pain and difficulty.    Goal Status:  INITIAL  Target date:  09/16/2021        PLAN: PT FREQUENCY: 1-2x/week   PT DURATION: 4 weeks   PLANNED INTERVENTIONS: Therapeutic exercises, Therapeutic activity, Neuromuscular re-education, Patient/Family education, Joint mobilization, Aquatic Therapy, Dry Needling, Electrical stimulation, Cryotherapy, Moist heat, Taping, Ultrasound, Manual therapy, and Re-evaluation   PLAN FOR NEXT SESSION:  Cont with ther ex, proprio, and MT.  Scapular, shoulder, and RTC strengthening and stability.      Selinda Michaels III PT, DPT 08/22/21 11:53 PM

## 2021-09-05 ENCOUNTER — Encounter (HOSPITAL_BASED_OUTPATIENT_CLINIC_OR_DEPARTMENT_OTHER): Payer: Self-pay | Admitting: Physical Therapy

## 2021-09-05 ENCOUNTER — Ambulatory Visit (HOSPITAL_BASED_OUTPATIENT_CLINIC_OR_DEPARTMENT_OTHER): Payer: No Typology Code available for payment source | Attending: Orthopaedic Surgery | Admitting: Physical Therapy

## 2021-09-05 DIAGNOSIS — M25612 Stiffness of left shoulder, not elsewhere classified: Secondary | ICD-10-CM | POA: Insufficient documentation

## 2021-09-05 DIAGNOSIS — M25512 Pain in left shoulder: Secondary | ICD-10-CM | POA: Insufficient documentation

## 2021-09-05 DIAGNOSIS — M6281 Muscle weakness (generalized): Secondary | ICD-10-CM | POA: Insufficient documentation

## 2021-09-05 DIAGNOSIS — G8929 Other chronic pain: Secondary | ICD-10-CM | POA: Insufficient documentation

## 2021-09-05 NOTE — Therapy (Signed)
OUTPATIENT PHYSICAL THERAPY TREATMENT NOTE    Patient Name: Hannah Galloway MRN: [id number] DOB:[birth year], 56 y.o., female Today's Date: 07/10/2021     PT End of Session - 09/05/21 0823     Visit Number 11    Number of Visits 15    Date for PT Re-Evaluation 09/16/21    PT Start Time 0803    PT Stop Time 0846    PT Time Calculation (min) 43 min    Activity Tolerance Patient tolerated treatment well    Behavior During Therapy [facility] for tasks assessed/performed                      Past Medical History:  Diagnosis Date   Ascending aortic aneurysm ([place])    Bilateral carpal tunnel syndrome 08/09/2018   Glaucoma    Past Surgical History:  Procedure Laterality Date   BACK SURGERY     SPINE SURGERY  July 2010   TONSILLECTOMY AND ADENOIDECTOMY  1974   Patient Active Problem List   Diagnosis Date Noted   Encounter for annual physical exam 03/08/2021   Plantar fasciitis 09/20/2019   Thoracic aortic aneurysm ([place]) 01/12/2019   Bilateral carpal tunnel syndrome 08/09/2018   REFERRING PROVIDER: Vanetta Mulders, MD   REFERRING DIAG: [phone] (ICD-10-CM) - Chronic left shoulder pain    THERAPY DIAG:             Chronic Left Shoulder pain            Muscle weakness (generalized)            Stiffness of left shoulder, not elsewhere classified   ONSET DATE: Approx May 2022 ; Exacerbated in February   SUBJECTIVE:                                                                                                                                                                                       SUBJECTIVE STATEMENT: Pt states she has not been having pain for the most part.  She can feel it this AM, but not painful.  It feels dull and tight this AM.  "It continues to improve".  Pt reports her shoulder doesn't stop her from doing things anymore.  Her shoulder has not been limiting her.  Pt is not using medication or TENS unit as much.   Pt denies any adverse effects  after prior Rx.  Pt denies pain currently.  Pt reports compliance with HEP.     PERTINENT HISTORY: Ascending thoracic aortic aneurysm--Pt reports having no restrictions, L4-L5 discecomty in 2009, Bilateral carpal tunnel syndrome with surgery on R in 08/09/2018     PAIN:  Are you having pain? No currently NPRS scale: 0/10 current, 2/10 Worst, 0/10 best Pain location: L UT, superomedial scapula, shoulder  Pain orientation: Left  PAIN TYPE: dull, soreness.  Pt states she still has tingling in hand.   Pain description: intermittent    PRECAUTIONS: Other: Ascending thoracic aortic aneurysm, L4-5 discectomy   WEIGHT BEARING RESTRICTIONS No   FALLS:  Has patient fallen in last 6 months? Yes. 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    PLOF: Independent; Pt was able to perform her normal reaching, work, and daily activities without pain or limitation.  Pt able to sleep without shoulder pain waking her up.   PATIENT GOALS wants to be able to lift her arm out to the side, increase sleep without disturbance, to improve pain   OBJECTIVE:    DIAGNOSTIC FINDINGS:  MD note indicated Xray was normal.   X rays in Epic indicated IMPRESSION: Mild peripheral glenoid degenerative osteophytosis. On axillary view, there is a small curvilinear lucency overlying the far anterior aspect of the glenoid, and it is difficult to exclude a small nondisplaced fracture in this region.                TODAY'S TREATMENT:                                    Therapeutic Exercise:   Reviewed current function, pain level, response to prior Rx, and home management strategies. Pt performed:                        UBE at L2 x 3 mins fwd and 1 min bwd Standing ER/IR with arm at side: RTB 3x10 with ER and 2x10 with IR; GTB 2x10 with IR   Jobe's flexion 3 x 10 reps with 2#   Standing scaption 3 x 10 reps with 2#     4D  ball rolls at 90 deg flexion 2x10 with 2.2 lb   Protraction/retraction on wall 3x10 reps   Lateral band walks with RTB 3x5 reps             Manual Therapy:   Pt received STM to L UT and medial scap mm and IASTM to L UT and medial scap mm to improve pain and reduce myofascial tightness and adhesions.       PATIENT EDUCATION: Education details:  Reviewed HEP and updated HEP.  Gave pt a HEP handout and educated in correct form and appropriate frequency.  Objective findings, relevant anatomy, POC, and exercise form/rationale. Person educated: Patient Education method: Explanation, Demonstration, Tactile cues, Verbal cues Education comprehension: verbalized understanding, returned demonstration, verbal cues required, tactile cues required, and needs further education     HOME EXERCISE PROGRAM: Access Code: [email or web address] URL: [email or web address]   ASSESSMENT:   CLINICAL IMPRESSION: Pt is making great progress in pain and sx's.  She reports she is not having much pain anymore.  She is using her L UE with increased activities including reaching activities without pain.  Pt has improved soft tissue tightness with reduced mm spasm.  Pt demonstrates improved strength and stability t/o L shoulder as evidenced by performance of exercises with increased resistance and increased intensity.  Pt demonstrates improved form with protraction/retraction on wall.  Pt responded well  to Rx having no pain after Rx.  Pt should continue to benefit from cont skilled PT services to address ongoing goals and impairments and to restore PLOF.   OBJECTIVE IMPAIRMENTS decreased activity tolerance, decreased endurance, decreased ROM, decreased strength, impaired UE functional use, and pain.    ACTIVITY LIMITATIONS cleaning, meal prep, occupation, and shopping.    PERSONAL FACTORS Time since onset of injury/illness/exacerbation and 1-2 comorbidities: Ascending thoracic aortic aneurysm and L4-L5 discecomty in  2009  are also affecting patient's functional outcome.        REHAB POTENTIAL: Good   CLINICAL DECISION MAKING: Stable/uncomplicated   EVALUATION COMPLEXITY: Low     GOALS:     SHORT TERM GOALS:    Pt will be independent and compliant with HEP for improved pain, strength, ROM, and function.  Baseline: Goal status: achieved Target date:  07/29/2021   2.  Pt will be able to hold a cell phone to her ear without increased pain or difficulty.  Baseline:   Goal status: haven't tried lately, but thinks she can Target date:  07/29/2021   3.  Pt will demo at least a 20 deg improvement in flexion AROM and 30 deg improvement in scaption and abduction AROM for improved reaching and performance of ADLs.  Baseline: 6/16: flexion 144   abd 95 Goal status: achieved Target date:  07/29/2021   4.  Pt will report at least a 25% improvement in pain and sx's overall. Baseline:  Goal status: achieved Target date:  07/29/2021         LONG TERM GOALS:    Pt will demo L shoulder AROM to be w/n 10 deg of R shoulder for improved performance of ADLs and IADLs.  Baseline:  Goal status: PROGRESSING Target date:  09/16/2021   2.  Pt will be able to sleep at least 5/7 nights/wk without pain disturbance.   Baseline:  Goal status: NOT MET Target date: 09/16/2021   3.  Pt will be able to perform her ADLs and IADLs with L UE without significant pain or limitations.  Baseline:  Goal status: GOAL MET Target date: 08/19/2021   4.  Pt will demo improved L shoulder and scap strength to = R for improved functional lifting/carrying and performance of daily activities.  Baseline:  Goal status: NOT MET Target date: 09/16/2021  5.  Pt will report she is able to carry in her work supplies without significant pain and difficulty.    Goal Status:  INITIAL  Target date:  09/16/2021        PLAN: PT FREQUENCY: 1-2x/week   PT DURATION: 4 weeks   PLANNED INTERVENTIONS: Therapeutic exercises, Therapeutic  activity, Neuromuscular re-education, Patient/Family education, Joint mobilization, Aquatic Therapy, Dry Needling, Electrical stimulation, Cryotherapy, Moist heat, Taping, Ultrasound, Manual therapy, and Re-evaluation   PLAN FOR NEXT SESSION:   Cont with ther ex, proprio, and MT.  Scapular, shoulder, and RTC strengthening and stability.      Selinda Michaels III PT, DPT 09/05/21 10:14 PM

## 2021-09-10 ENCOUNTER — Encounter (HOSPITAL_BASED_OUTPATIENT_CLINIC_OR_DEPARTMENT_OTHER): Payer: Self-pay | Admitting: Physical Therapy

## 2021-09-10 ENCOUNTER — Ambulatory Visit (HOSPITAL_BASED_OUTPATIENT_CLINIC_OR_DEPARTMENT_OTHER): Payer: No Typology Code available for payment source | Admitting: Physical Therapy

## 2021-09-10 ENCOUNTER — Other Ambulatory Visit (HOSPITAL_COMMUNITY): Payer: Self-pay

## 2021-09-10 DIAGNOSIS — M6281 Muscle weakness (generalized): Secondary | ICD-10-CM

## 2021-09-10 DIAGNOSIS — M25612 Stiffness of left shoulder, not elsewhere classified: Secondary | ICD-10-CM

## 2021-09-10 DIAGNOSIS — M25512 Pain in left shoulder: Secondary | ICD-10-CM | POA: Diagnosis not present

## 2021-09-10 DIAGNOSIS — G8929 Other chronic pain: Secondary | ICD-10-CM

## 2021-09-10 MED ORDER — TOBRADEX 0.3-0.1 % OP OINT
TOPICAL_OINTMENT | OPHTHALMIC | 0 refills | Status: DC
  Filled 2021-09-10: qty 3.5, 7d supply, fill #0

## 2021-09-10 NOTE — Therapy (Signed)
OUTPATIENT PHYSICAL THERAPY TREATMENT NOTE    Patient Name: Hannah Galloway MRN: [id number] DOB:Jun 05, 1965, 56 y.o., female Today's Date: 07/10/2021     PT End of Session - 09/10/21 0905     Visit Number 12    Number of Visits 12    PT Start Time 0803    PT Stop Time 0850    PT Time Calculation (min) 47 min    Activity Tolerance Patient tolerated treatment well    Behavior During Therapy [facility] for tasks assessed/performed                 Past Medical History:  Diagnosis Date   Ascending aortic aneurysm ([place])    Bilateral carpal tunnel syndrome 08/09/2018   Glaucoma    Past Surgical History:  Procedure Laterality Date   [place]  July 2010   TONSILLECTOMY AND ADENOIDECTOMY  1974   Patient Active Problem List   Diagnosis Date Noted   Encounter for annual physical exam 03/08/2021   Plantar fasciitis 09/20/2019   Thoracic aortic aneurysm ([place]) 01/12/2019   Bilateral carpal tunnel syndrome 08/09/2018   REFERRING PROVIDER: Vanetta Mulders, MD   REFERRING DIAG: [id number] (ICD-10-CM) - Chronic left shoulder pain    THERAPY DIAG:             Chronic Left Shoulder pain            Muscle weakness (generalized)            Stiffness of left shoulder, not elsewhere classified   ONSET DATE: Approx May 2022 ; Exacerbated in February   SUBJECTIVE:                                                                                                                                                                                       SUBJECTIVE STATEMENT: Pt states she is doing very well.  Pt states today will be her last day due to her schedule.  Pt states she returned to her workout routine yesterday and just used lighter weights.  Pt reports having some soreness today though no increased pain.  Pt denies any adverse effects after prior Rx.  Pt reports she is sleeping the best she has in months.  Pt denies pain currently.  Pt reports compliance with HEP.   Pt states she has not been having any pain.  Pt reports she is not limited with any of her daily activities.  Pt reports she is able to hod a cell phone to her ear without pain.    PERTINENT HISTORY: Ascending thoracic aortic aneurysm--Pt reports having no restrictions, L4-L5 discecomty in 2009,  Bilateral carpal tunnel syndrome with surgery on R in 08/09/2018     PAIN:  Are you having pain? No currently NPRS scale: 0/10 current, 1/10 Worst, 0/10 best Pain location: L UT, superomedial scapula, shoulder  Pain orientation: Left  PAIN TYPE: dull, soreness.  Pt states she still has tingling in hand.   Pain description: intermittent    PRECAUTIONS: Other: Ascending thoracic aortic aneurysm, L4-5 discectomy   WEIGHT BEARING RESTRICTIONS No   FALLS:  Has patient fallen in last 6 months? Yes. 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  Pt does a lot of computer work.    PLOF: Independent; Pt was able to perform her normal reaching, work, and daily activities without pain or limitation.  Pt able to sleep without shoulder pain waking her up.   PATIENT GOALS wants to be able to lift her arm out to the side, increase sleep without disturbance, to improve pain   OBJECTIVE:    DIAGNOSTIC FINDINGS:  MD note indicated Xray was normal.   X rays in Epic indicated IMPRESSION: Mild peripheral glenoid degenerative osteophytosis. On axillary view, there is a small curvilinear lucency overlying the far anterior aspect of the glenoid, and it is difficult to exclude a small nondisplaced fracture in this region.                TODAY'S TREATMENT:   UPPER EXTREMITY ROM:    AROM/PROM Right 07/09/2021 Left 07/09/2021 Left 08/19/2021 Left 09/10/2021  Shoulder flexion 171 124 with pain/163 139 with pain/170 164  Shoulder scaption 170 70 with pain 126 with discomfort 138  Shoulder abduction 158 60 with pain/150 115 with pain/170 146  Shoulder  adduction         Shoulder internal rotation 75 75     Shoulder external rotation 80 63/73     Elbow flexion         Elbow extension         Wrist flexion         Wrist extension         Wrist ulnar deviation         Wrist radial deviation         Wrist pronation         Wrist supination         (Blank rows = not tested)  L shoulder strength:   flex:  4+/5, scaption:  4-/5, abd:  4-/5,ER:  16.6 HHD, 4+/5, IR:  5/5.  [place]: 3+/5                                   Therapeutic Exercise:   Reviewed current function, pain level, response to prior Rx, and home management strategies. Assessed ROM and strength Pt performed:                        UBE at L2 x 3 mins fwd and 1 min bwd   Jobe's flexion 2 x 10 reps with 2#, 1x10 with 3#   Standing scaption 3 x 10 reps with 2# Reviewed and updated HEP.  Pt received a HEP handout and was educated in correct form and appropriate frequency.      Neuro Re-ed Activities: 4D ball rolls at 90 deg flexion 2x10 with 2.2 lb   Protraction/retraction on wall 3x10  reps   Lateral band walks with RTB 3x5 reps     PATIENT EDUCATION: Education details:  Reviewed HEP and updated HEP.  Gave pt a HEP handout and educated in correct form and appropriate frequency.  Objective findings, relevant anatomy, POC, and exercise form/rationale. Person educated: Patient Education method: Explanation, Demonstration, Tactile cues, Verbal cues Education comprehension: verbalized understanding, returned demonstration, verbal cues required, tactile cues required, and needs further education     HOME EXERCISE PROGRAM: Access Code: [email or web address] URL: [email or web address] Updated HEP: - Standing Shoulder Flexion with Dumbbells  - 1 x daily - 3 x weekly - 2-3 sets - 10 reps - Scaption with Dumbbells  - 1 x daily - 3 x weekly - 2-3 sets - 10 reps - Standing Wall [employer] with Mini Swiss Ball  - 1 x daily - 3-4 x weekly - 2 sets - 10 reps - Horizontal  Wall Walk with Resistance  - 1 x daily - 3 x weekly - 1-2 sets - 3 reps - Wall Serratus plus  - 1 x daily - 4 x weekly - 2 sets - 10 reps   ASSESSMENT:   CLINICAL IMPRESSION: Pt has made excellent progress in all areas.  Pt reports she is not really having any pain and is not limited with any activities.  She is using her L UE with increased activities including reaching activities without pain.  Pt reports much improvement in sleeping.  She has improved soft tissue tightness with reduced mm spasm.  Pt demonstrates much improvement in UE elevation t/o L shoulder without pain.  Pt continues to have strength deficits in L shoulder and scapula though has improved.  She demonstrates improved tolerance with strengthening and stability exercises in the clinic.  PT updated HEP and pt is independent with HEP.  Pt has met all STG's and LTG's #2,3.  She has progressed toward other LTG's.  Pt informed PT that today would be her last day due to her upcoming busy schedule.       OBJECTIVE IMPAIRMENTS decreased activity tolerance, decreased endurance, decreased ROM, decreased strength, impaired UE functional use, and pain.    ACTIVITY LIMITATIONS cleaning, meal prep, occupation, and shopping.    PERSONAL FACTORS Time since onset of injury/illness/exacerbation and 1-2 comorbidities: Ascending thoracic aortic aneurysm and L4-L5 discecomty in 2009  are also affecting patient's functional outcome.        REHAB POTENTIAL: Good   CLINICAL DECISION MAKING: Stable/uncomplicated   EVALUATION COMPLEXITY: Low     GOALS:     SHORT TERM GOALS:    Pt will be independent and compliant with HEP for improved pain, strength, ROM, and function.  Baseline: Goal status: achieved Target date:  07/29/2021   2.  Pt will be able to hold a cell phone to her ear without increased pain or difficulty.  Baseline:   Goal status: GOAL MET Target date:  07/29/2021   3.  Pt will demo at least a 20 deg improvement in flexion AROM  and 30 deg improvement in scaption and abduction AROM for improved reaching and performance of ADLs.  Baseline:  Goal status: achieved Target date:  07/29/2021   4.  Pt will report at least a 25% improvement in pain and sx's overall. Baseline:  Goal status: achieved Target date:  07/29/2021         LONG TERM GOALS:    Pt will demo L shoulder AROM to be w/n 10 deg of R shoulder for improved performance of ADLs  and IADLs.  Baseline:  Goal status: PROGRESSING Target date:  09/16/2021   2.  Pt will be able to sleep at least 5/7 nights/wk without pain disturbance.   Baseline:  Goal status: GOAL MET Target date: 09/16/2021   3.  Pt will be able to perform her ADLs and IADLs with L UE without significant pain or limitations.  Baseline:  Goal status: GOAL MET Target date: 08/19/2021   4.  Pt will demo improved L shoulder and scap strength to = R for improved functional lifting/carrying and performance of daily activities.  Baseline:  Goal status: PROGRESSING Target date: 09/16/2021  5.  Pt will report she is able to carry in her work supplies without significant pain and difficulty.    Goal Status:  Pt has a rolling briefcase and hasn't been carrying work supplies.    Target date:  09/16/2021        PLAN: PT FREQUENCY: 1-2x/week   PT DURATION: 4 weeks   PLANNED INTERVENTIONS: Therapeutic exercises, Therapeutic activity, Neuromuscular re-education, Patient/Family education, Joint mobilization, Aquatic Therapy, Dry Needling, Electrical stimulation, Cryotherapy, Moist heat, Taping, Ultrasound, Manual therapy, and Re-evaluation   PLAN FOR NEXT SESSION:   Pt will be discharged from skilled PT services due to good functional progress and due to pt's upcoming schedule. Pt is agreeable with discharge.  She will cont with HEP.    PHYSICAL THERAPY DISCHARGE SUMMARY  Visits from Start of Care: 12  Current functional level related to goals / functional outcomes: See above   Remaining  deficits: See above   Education / Equipment: Pt has a HEP       Selinda Michaels III PT, DPT 09/10/21 2:52 PM

## 2021-09-18 ENCOUNTER — Encounter (HOSPITAL_BASED_OUTPATIENT_CLINIC_OR_DEPARTMENT_OTHER): Payer: No Typology Code available for payment source | Admitting: Physical Therapy

## 2021-10-03 ENCOUNTER — Other Ambulatory Visit (HOSPITAL_BASED_OUTPATIENT_CLINIC_OR_DEPARTMENT_OTHER): Payer: Self-pay | Admitting: Cardiology

## 2021-10-03 DIAGNOSIS — I7121 Aneurysm of the ascending aorta, without rupture: Secondary | ICD-10-CM

## 2021-10-07 ENCOUNTER — Ambulatory Visit (INDEPENDENT_AMBULATORY_CARE_PROVIDER_SITE_OTHER): Payer: No Typology Code available for payment source | Admitting: Cardiology

## 2021-10-07 ENCOUNTER — Encounter (HOSPITAL_BASED_OUTPATIENT_CLINIC_OR_DEPARTMENT_OTHER): Payer: Self-pay | Admitting: Cardiology

## 2021-10-07 VITALS — BP 100/80 | HR 64 | Ht 66.0 in | Wt 156.0 lb

## 2021-10-07 DIAGNOSIS — Z8249 Family history of ischemic heart disease and other diseases of the circulatory system: Secondary | ICD-10-CM | POA: Diagnosis not present

## 2021-10-07 DIAGNOSIS — I7121 Aneurysm of the ascending aorta, without rupture: Secondary | ICD-10-CM

## 2021-10-07 DIAGNOSIS — Z7189 Other specified counseling: Secondary | ICD-10-CM

## 2021-10-07 NOTE — Progress Notes (Signed)
Cardiology Office Note:    Date:  10/07/2021   ID:  Hannah Galloway, DOB August 25, 1965, MRN [id number]  PCP:  Orma Render, NP  Cardiologist:  Buford Dresser, MD  Referring MD: Orma Render, NP   CC: follow up TAA   History of Present Illness:    Hannah Galloway is a 56 y.o. female with a hx of ascending aortic aneurysm who is seen for follow-up.   Cardiovascular risk factors: Prior clinical ASCVD: Ascending aortic aneurysm (dx 3 yrs ago). About 3 years ago, she began having severe chest pains while getting ready for bed. She felt like she couldn't get a deep breath. The next morning the chest pain recurred, and she went to the ED. At the ED, no indication of MI was found. However, she was dx with the aortic aneurysm.  Comorbid conditions, including hypertension, hyperlipidemia, diabetes, chronic kidney disease: None Metabolic syndrome/Obesity: BMI 25.21 Chronic inflammatory conditions: None Tobacco use history: Never Family history: Told that her father, paternal grandfather and paternal uncle all died of a massive heart attack. However she does not know this for certain (ie we discussed if it might have been an aortic dissection, but there is no way to know for sure) Prior cardiac testing and/or incidental findings on other testing (ie coronary calcium): reviewed prior Cts and MRAs today. No calcium on imaging  At her last appointment she noted palpitations that occurred while sitting at work. We had discussed [employer] as an option for evaluating this. Given stability of size/shape of ascending aortic aneurysm, after shared decision making we will monitor every two years as long as it remains stable. Recheck MRA 12/2021.  Today: She states she has been feeling fine. Unfortunately, her mother suffered a TIA this past week.   From a cardiovascular standpoint she denies any new concerns. Every once in a while she may feel her heart "beating stronger." There is never any pain or  racing heart rates associated with her palpitations.  Recently she has seen orthopedics and diagnosed with scapular dyskinesis. She completed some physical therapy for this and her pain has significantly improved.  She also complains of ear issues that she believes may be related to the onset of an ear infection. She will plan to follow up with Jacolyn Reedy, NP.  She denies any chest pain, shortness of breath, or peripheral edema. No lightheadedness, headaches, syncope, orthopnea, or PND.   Past Medical History:  Diagnosis Date   Ascending aortic aneurysm [facility])    Bilateral carpal tunnel syndrome 08/09/2018   Glaucoma     Past Surgical History:  Procedure Laterality Date   BACK SURGERY     SPINE SURGERY  July 2010   TONSILLECTOMY AND ADENOIDECTOMY  1974    Current Medications: Current Outpatient Medications on File Prior to Visit  Medication Sig   Multiple Vitamins-Minerals (MULTIVITAMIN ADULTS 50+ PO) Take 1 tablet by mouth daily.   No current facility-administered medications on file prior to visit.     Allergies:   Quinolones   Social History   Tobacco Use   Smoking status: Never   Smokeless tobacco: Never  Vaping Use   Vaping Use: Never used  Substance Use Topics   Alcohol use: No   Drug use: Never    Family History: family history includes Arthritis in her mother; Asthma in her mother; Cancer in her brother; Diabetes in her father; Heart attack in her father; Heart disease in her father; Hypertension in her father and mother;  Lymphoma (age of onset: 48) in her brother; Stroke in her father.  ROS:   Please see the history of present illness. (+) Palpitations All other systems are reviewed and negative.    EKGs/Labs/Other Studies Reviewed:    The following studies were reviewed today:  MRA 01/17/20: Aorta: Similar appearing mobile fusiform ectasia of the ascending aorta measuring up to 37 mm. Widely patent. Conventional branching pattern of the aortic arch  without evidence of significant stenosis.  MRA 01/07/19: Aorta: Similar mild fusiform aneurysmal dilatation of the ascending thoracic aorta, maximal diameter 3.7 cm. No significant interval change. No interval dissection. Major branch vessels remain patent. Descending thoracic aorta is normal in caliber measuring 2 cm in diameter.  CT calcium score 04/30/18: 1. Coronary calcium score of 0. This was 0 percentile for age and sex matched control.   2. Upper normal size of the ascending aorta measuring 40 mm in the axial views. A dedicated chest CTA or MRA is recommended.  TTE 03/12/2018: - Left ventricle: The cavity size was normal. Systolic function was    normal. The estimated ejection fraction was in the range of 55%    to 60%. Wall motion was normal; there were no regional wall    motion abnormalities. Left ventricular diastolic function    parameters were normal.  - Atrial septum: No defect or patent foramen ovale was identified.   CT PE 12/29/2017: Cardiovascular: Pulmonary arterial opacification is good. There are no pulmonary emboli. No aortic calcification is seen. The ascending aorta is slightly prominent at 3.7 cm. Heart size is normal. No coronary artery calcification is visible.   EKG:  EKG is personally reviewed.   10/07/2021: NSR with sinus arrhythmia at 64 bpn, iRBBB 10/03/2020: NSR at 75 bpm  Recent Labs: 03/06/2021: ALT 12; BUN 14; Creatinine, Ser 0.84; Hemoglobin 13.5; Platelets 289; Potassium 4.6; Sodium 138; TSH 1.050   Recent Lipid Panel    Component Value Date/Time   CHOL 204 (H) 03/06/2021 0928   TRIG 65 03/06/2021 0928   HDL 67 03/06/2021 0928   CHOLHDL 3.0 03/06/2021 0928   LDLCALC 125 (H) 03/06/2021 0928    Physical Exam:    VS:  BP 100/80 (BP Location: Left Arm, Patient Position: Sitting, Cuff Size: Normal)   Pulse 64   Ht '5\' 6"'$  (1.676 m)   Wt 156 lb (70.8 kg)   SpO2 97%   BMI 25.18 kg/m     Wt Readings from Last 3 Encounters:  10/07/21 156  lb (70.8 kg)  05/14/21 156 lb 9.6 oz (71 kg)  03/06/21 158 lb (71.7 kg)    GEN: Well nourished, well developed in no acute distress HEENT: Normal, moist mucous membranes NECK: No JVD CARDIAC: regular rhythm, normal S1 and S2, no rubs or gallops. No murmur. VASCULAR: Radial and DP pulses 2+ bilaterally. No carotid bruits RESPIRATORY:  Clear to auscultation without rales, wheezing or rhonchi  ABDOMEN: Soft, non-tender, non-distended MUSCULOSKELETAL:  Ambulates independently SKIN: Warm and dry, no edema NEUROLOGIC:  Alert and oriented x 3. No focal neuro deficits noted. PSYCHIATRIC:  Normal affect    ASSESSMENT:    1. Aneurysm of ascending aorta without rupture (HCC)   2. Cardiac risk counseling   3. Counseling on health promotion and disease prevention   4. Family history of heart disease     PLAN:    Thoracic aortic aneurysm: -has been stable at 3.7 cm when measured in 2019, 2020, 2021 -no known family history of aortic disease (though has  been told family members died of MI, cannot exclude potential other etiology) -no known history of connective tissue diseases -has tricuspid aortic valve -fluoroquinolones already on adverse medication list -given stability of size/shape, after shared decision making we will monitor every two years as long as it remains stable.  -blood pressure at goal -lipids borderline in 2020, but no coronary calcium and low ASCVD risk -recheck MRA 12/2021. OK to wait until early 2024 if preferred from an insurance standpoint -if stable, repeat MRA 2 years later with followup after  Cardiac risk counseling and prevention recommendations: -recommend heart healthy/Mediterranean diet, with whole grains, fruits, vegetable, fish, lean meats, nuts, and olive oil. Limit salt. -recommend moderate walking, 3-5 times/week for 30-50 minutes each session. Aim for at least 150 minutes.week. Goal should be pace of 3 miles/hours, or walking 1.5 miles in 30  minutes -recommend avoidance of tobacco products. Avoid excess alcohol. -ASCVD risk score: The 10-year ASCVD risk score (Arnett DK, et al., 2019) is: 0.8%   Values used to calculate the score:     Age: 72 years     Sex: Female     Is Non-Hispanic African American: No     Diabetic: No     Tobacco smoker: No     Systolic Blood Pressure: 494 mmHg     Is BP treated: No     HDL Cholesterol: 67 mg/dL     Total Cholesterol: 204 mg/dL    Plan for follow up: MRA in ~02/2022, follow-up in 02/2024, or sooner as needed.  Buford Dresser, MD, PhD, [facility]    Medication Adjustments/Labs and Tests Ordered: Current medicines are reviewed at length with the patient today.  Concerns regarding medicines are outlined above.   Orders Placed This Encounter  Procedures   EKG 12-Lead   No orders of the defined types were placed in this encounter.  Patient Instructions  Medication Instructions:  Your Physician recommend you continue on your current medication as directed.    *If you need a refill on your cardiac medications before your next appointment, please call your pharmacy*   Lab Work: None ordered today   Testing/Procedures: None ordered today   Follow-Up: At [facility], you and your health needs are our priority.  As part of our continuing mission to provide you with exceptional heart care, we have created designated Provider Care Teams.  These Care Teams include your primary Cardiologist (physician) and Advanced Practice Providers (APPs -  Physician Assistants and Nurse Practitioners) who all work together to provide you with the care you need, when you need it.  We recommend signing up for the patient portal called "MyChart".  Sign up information is provided on this After Visit Summary.  MyChart is used to connect with patients for Virtual Visits (Telemedicine).  Patients are able to view lab/test results, encounter notes, upcoming appointments, etc.   Non-urgent messages can be sent to your provider as well.   To learn more about what you can do with MyChart, go to [email or web address].    Your next appointment:   January, 2026  The format for your next appointment:   In Person  Provider:   Buford Dresser, MD{           I,Mathew Stumpf,acting as a scribe for Buford Dresser, MD.,have documented all relevant documentation on the behalf of Buford Dresser, MD,as directed by  Buford Dresser, MD while in the presence of Buford Dresser, MD.  I, Buford Dresser, MD, have reviewed  all documentation for this visit. The documentation on 10/07/21 for the exam, diagnosis, procedures, and orders are all accurate and complete.   Signed, Buford Dresser, MD PhD 10/07/2021 8:33 PM    [place]

## 2021-10-07 NOTE — Patient Instructions (Signed)
Medication Instructions:  Your Physician recommend you continue on your current medication as directed.    *If you need a refill on your cardiac medications before your next appointment, please call your pharmacy*   Lab Work: None ordered today   Testing/Procedures: None ordered today   Follow-Up: At [facility], you and your health needs are our priority.  As part of our continuing mission to provide you with exceptional heart care, we have created designated Provider Care Teams.  These Care Teams include your primary Cardiologist (physician) and Advanced Practice Providers (APPs -  Physician Assistants and Nurse Practitioners) who all work together to provide you with the care you need, when you need it.  We recommend signing up for the patient portal called "MyChart".  Sign up information is provided on this After Visit Summary.  MyChart is used to connect with patients for Virtual Visits (Telemedicine).  Patients are able to view lab/test results, encounter notes, upcoming appointments, etc.  Non-urgent messages can be sent to your provider as well.   To learn more about what you can do with MyChart, go to [email or web address].    Your next appointment:   January, 2026  The format for your next appointment:   In Person  Provider:   Buford Dresser, MD{

## 2021-10-31 ENCOUNTER — Other Ambulatory Visit (HOSPITAL_COMMUNITY): Payer: Self-pay

## 2021-10-31 MED ORDER — NA SULFATE-K SULFATE-MG SULF 17.5-3.13-1.6 GM/177ML PO SOLN
ORAL | 0 refills | Status: DC
  Filled 2021-10-31 – 2021-11-11 (×2): qty 354, 1d supply, fill #0

## 2021-11-07 ENCOUNTER — Other Ambulatory Visit (HOSPITAL_COMMUNITY): Payer: Self-pay

## 2021-11-11 ENCOUNTER — Other Ambulatory Visit (HOSPITAL_COMMUNITY): Payer: Self-pay

## 2021-11-12 ENCOUNTER — Other Ambulatory Visit (HOSPITAL_COMMUNITY): Payer: Self-pay

## 2021-11-22 ENCOUNTER — Other Ambulatory Visit (HOSPITAL_BASED_OUTPATIENT_CLINIC_OR_DEPARTMENT_OTHER): Payer: Self-pay

## 2021-12-04 ENCOUNTER — Other Ambulatory Visit (HOSPITAL_BASED_OUTPATIENT_CLINIC_OR_DEPARTMENT_OTHER): Payer: Self-pay

## 2021-12-25 ENCOUNTER — Telehealth (HOSPITAL_BASED_OUTPATIENT_CLINIC_OR_DEPARTMENT_OTHER): Payer: Self-pay | Admitting: Cardiology

## 2021-12-25 NOTE — Telephone Encounter (Signed)
Left message for patient to call and discuss scheduling the MRA chest ordered by Dr. Harrell Gave

## 2022-03-03 ENCOUNTER — Ambulatory Visit (HOSPITAL_COMMUNITY)
Admission: RE | Admit: 2022-03-03 | Discharge: 2022-03-03 | Disposition: A | Discharge status: Home / Self Care | Payer: 59 | Source: Ambulatory Visit | Attending: Cardiology | Admitting: Cardiology

## 2022-03-03 DIAGNOSIS — I77810 Thoracic aortic ectasia: Secondary | ICD-10-CM | POA: Diagnosis not present

## 2022-03-03 DIAGNOSIS — I7121 Aneurysm of the ascending aorta, without rupture: Secondary | ICD-10-CM | POA: Diagnosis not present

## 2022-03-03 DIAGNOSIS — Q676 Pectus excavatum: Secondary | ICD-10-CM | POA: Diagnosis not present

## 2022-03-03 MED ORDER — GADOBUTROL 1 MMOL/ML IV SOLN
7.0000 mL | Freq: Once | INTRAVENOUS | Status: AC | PRN
  Administered 2022-03-03: 7 mL via INTRAVENOUS

## 2022-03-13 ENCOUNTER — Other Ambulatory Visit (HOSPITAL_BASED_OUTPATIENT_CLINIC_OR_DEPARTMENT_OTHER): Payer: Self-pay | Admitting: Family Medicine

## 2022-03-13 DIAGNOSIS — Z1231 Encounter for screening mammogram for malignant neoplasm of breast: Secondary | ICD-10-CM

## 2022-03-17 ENCOUNTER — Encounter (HOSPITAL_BASED_OUTPATIENT_CLINIC_OR_DEPARTMENT_OTHER): Payer: Self-pay

## 2022-04-10 DIAGNOSIS — Z1211 Encounter for screening for malignant neoplasm of colon: Secondary | ICD-10-CM | POA: Diagnosis not present

## 2022-04-10 DIAGNOSIS — D125 Benign neoplasm of sigmoid colon: Secondary | ICD-10-CM | POA: Diagnosis not present

## 2022-04-10 DIAGNOSIS — K635 Polyp of colon: Secondary | ICD-10-CM | POA: Diagnosis not present

## 2022-04-10 DIAGNOSIS — D123 Benign neoplasm of transverse colon: Secondary | ICD-10-CM | POA: Diagnosis not present

## 2022-04-16 ENCOUNTER — Ambulatory Visit (HOSPITAL_BASED_OUTPATIENT_CLINIC_OR_DEPARTMENT_OTHER)
Admission: RE | Admit: 2022-04-16 | Discharge: 2022-04-16 | Disposition: A | Discharge status: Home / Self Care | Payer: 59 | Source: Ambulatory Visit | Attending: Family Medicine | Admitting: Family Medicine

## 2022-04-16 ENCOUNTER — Encounter (HOSPITAL_BASED_OUTPATIENT_CLINIC_OR_DEPARTMENT_OTHER): Payer: 59 | Admitting: Family Medicine

## 2022-04-16 DIAGNOSIS — Z1231 Encounter for screening mammogram for malignant neoplasm of breast: Secondary | ICD-10-CM | POA: Diagnosis not present

## 2022-05-12 ENCOUNTER — Encounter: Payer: Self-pay | Admitting: Gastroenterology

## 2022-06-05 ENCOUNTER — Encounter (HOSPITAL_BASED_OUTPATIENT_CLINIC_OR_DEPARTMENT_OTHER): Payer: Self-pay | Admitting: Obstetrics & Gynecology

## 2022-06-05 ENCOUNTER — Ambulatory Visit (INDEPENDENT_AMBULATORY_CARE_PROVIDER_SITE_OTHER): Payer: 59 | Admitting: Obstetrics & Gynecology

## 2022-06-05 VITALS — BP 110/75 | HR 63 | Ht 66.0 in | Wt 156.6 lb

## 2022-06-05 DIAGNOSIS — Z01419 Encounter for gynecological examination (general) (routine) without abnormal findings: Secondary | ICD-10-CM | POA: Diagnosis not present

## 2022-06-05 DIAGNOSIS — I7121 Aneurysm of the ascending aorta, without rupture: Secondary | ICD-10-CM | POA: Diagnosis not present

## 2022-06-05 NOTE — Progress Notes (Signed)
57 y.o. G25P2002 Married White or Caucasian female here for annual exam.  Doing well.  Denies vaginal bleeding.    No LMP recorded. Patient is perimenopausal.          Sexually active: Yes.    The current method of family planning is post menopausal status.    Exercising: Yes.   Beach body programs Smoker:  no  Health Maintenance: Pap:  05/22/2021 Negative History of abnormal Pap:  no MMG:  04/16/2022 Negative Colonoscopy:  04/10/2022, Dr. Elnoria Howard.  Follow up 3 years. BMD:   guidelines reviewed Screening Labs: will do with Dr. Ihor Dow   reports that she has never smoked. She has never used smokeless tobacco. She reports that she does not drink alcohol and does not use drugs.  Past Medical History:  Diagnosis Date   Ascending aortic aneurysm    Bilateral carpal tunnel syndrome 08/09/2018   Glaucoma     Past Surgical History:  Procedure Laterality Date   BACK SURGERY     SPINE SURGERY  July 2010   TONSILLECTOMY AND ADENOIDECTOMY  1974    Current Outpatient Medications  Medication Sig Dispense Refill   Multiple Vitamins-Minerals (MULTIVITAMIN ADULTS 50+ PO) Take 1 tablet by mouth daily.     Na Sulfate-K Sulfate-Mg Sulf (SUPREP BOWEL PREP KIT) 17.5-3.13-1.6 GM/177ML SOLN Take as directed. (Patient not taking: Reported on 06/05/2022) 354 mL 0   No current facility-administered medications for this visit.    Family History  Problem Relation Age of Onset   Stroke Father    Heart disease Father    Diabetes Father        type ll   Heart attack Father    Hypertension Father    Hypertension Mother    Arthritis Mother    Asthma Mother    Lymphoma Brother 78       t-call   Cancer Brother        T cell lymphoma    ROS: Constitutional: negative Genitourinary:negative  Exam:   BP 110/75 (BP Location: Right Arm, Patient Position: Sitting, Cuff Size: Normal)   Pulse 63   Ht 5\' 6"  (1.676 m) Comment: Reported  Wt 156 lb 9.6 oz (71 kg)   BMI 25.28 kg/m   Height: 5\' 6"  (167.6 cm)  (Reported)  General appearance: alert, cooperative and appears stated age Head: Normocephalic, without obvious abnormality, atraumatic Neck: no adenopathy, supple, symmetrical, trachea midline and thyroid normal to inspection and palpation Lungs: clear to auscultation bilaterally Breasts: normal appearance, no masses or tenderness Heart: regular rate and rhythm Abdomen: soft, non-tender; bowel sounds normal; no masses,  no organomegaly Extremities: extremities normal, atraumatic, no cyanosis or edema Skin: Skin color, texture, turgor normal. No rashes or lesions Lymph nodes: Cervical, supraclavicular, and axillary nodes normal. No abnormal inguinal nodes palpated Neurologic: Grossly normal   Pelvic: External genitalia:  no lesions              Urethra:  normal appearing urethra with no masses, tenderness or lesions              Bartholins and Skenes: normal                 Vagina: normal appearing vagina with normal color and no discharge, no lesions              Cervix: no lesions              Pap taken: No. Bimanual Exam:  Uterus:  normal size, contour,  position, consistency, mobility, non-tender              Adnexa: normal adnexa and no mass, fullness, tenderness               Rectovaginal: deferred due to recent colonoscopy               Anus:  normal sphincter tone, no lesions  Chaperone, Ina Homes, CMA, was present for exam.  Assessment/Plan: 1. Well woman exam with routine gynecological exam - Pap smear neg with neg HR HP 2023.  Not indicated today. - Mammogram 03/2022 - Colonoscopy 04/10/2022 with Dr. Dr. Elnoria Howard.  Colonoscopy due 3 years. - Bone mineral density guidelines - lab work done with Dr. Ihor Dow 02/2021 - vaccines reviewed/updated  2. Aneurysm of ascending aorta without rupture - followed by Dr. Cristal Deer in cardiology.  Imagines in January was stable.  Follow up 2 years.

## 2022-09-11 ENCOUNTER — Other Ambulatory Visit (HOSPITAL_COMMUNITY): Payer: Self-pay

## 2022-09-11 ENCOUNTER — Other Ambulatory Visit (HOSPITAL_BASED_OUTPATIENT_CLINIC_OR_DEPARTMENT_OTHER): Payer: Self-pay

## 2022-09-11 MED ORDER — TOBRADEX 0.3-0.1 % OP OINT
TOPICAL_OINTMENT | OPHTHALMIC | 0 refills | Status: DC
  Filled 2022-09-11: qty 3.5, 30d supply, fill #0
  Filled 2022-09-11: qty 3.5, 7d supply, fill #0

## 2022-09-12 ENCOUNTER — Other Ambulatory Visit (HOSPITAL_BASED_OUTPATIENT_CLINIC_OR_DEPARTMENT_OTHER): Payer: Self-pay

## 2022-09-15 ENCOUNTER — Other Ambulatory Visit (HOSPITAL_BASED_OUTPATIENT_CLINIC_OR_DEPARTMENT_OTHER): Payer: Self-pay

## 2022-09-26 ENCOUNTER — Other Ambulatory Visit (HOSPITAL_BASED_OUTPATIENT_CLINIC_OR_DEPARTMENT_OTHER): Payer: Self-pay

## 2022-10-22 DIAGNOSIS — H4010X1 Unspecified open-angle glaucoma, mild stage: Secondary | ICD-10-CM | POA: Diagnosis not present

## 2022-11-06 ENCOUNTER — Other Ambulatory Visit: Payer: Self-pay

## 2022-11-06 ENCOUNTER — Other Ambulatory Visit (HOSPITAL_COMMUNITY): Payer: Self-pay

## 2022-11-06 MED ORDER — TRAVOPROST (BAK FREE) 0.004 % OP SOLN
1.0000 [drp] | Freq: Every evening | OPHTHALMIC | 4 refills | Status: DC
  Filled 2022-11-06: qty 2.5, 25d supply, fill #0
  Filled 2022-12-02: qty 2.5, 25d supply, fill #1
  Filled 2023-01-01: qty 2.5, 25d supply, fill #2
  Filled 2023-02-04 – 2023-02-11 (×2): qty 2.5, 25d supply, fill #3
  Filled 2023-03-03: qty 2.5, 25d supply, fill #4

## 2022-12-03 ENCOUNTER — Other Ambulatory Visit (HOSPITAL_COMMUNITY): Payer: Self-pay

## 2022-12-04 ENCOUNTER — Encounter (HOSPITAL_BASED_OUTPATIENT_CLINIC_OR_DEPARTMENT_OTHER): Payer: Self-pay

## 2022-12-04 ENCOUNTER — Other Ambulatory Visit: Payer: Self-pay

## 2022-12-04 ENCOUNTER — Other Ambulatory Visit (HOSPITAL_BASED_OUTPATIENT_CLINIC_OR_DEPARTMENT_OTHER): Payer: Self-pay

## 2022-12-04 ENCOUNTER — Ambulatory Visit (INDEPENDENT_AMBULATORY_CARE_PROVIDER_SITE_OTHER): Payer: 59 | Admitting: Family Medicine

## 2022-12-04 ENCOUNTER — Encounter (HOSPITAL_BASED_OUTPATIENT_CLINIC_OR_DEPARTMENT_OTHER): Payer: Self-pay | Admitting: Family Medicine

## 2022-12-04 VITALS — BP 123/83 | HR 60 | Ht 66.0 in | Wt 160.0 lb

## 2022-12-04 DIAGNOSIS — H9202 Otalgia, left ear: Secondary | ICD-10-CM | POA: Diagnosis not present

## 2022-12-04 MED ORDER — BACITRA-NEOMYCIN-POLYMYXIN-HC 1 % OP OINT
1.0000 | TOPICAL_OINTMENT | Freq: Two times a day (BID) | OPHTHALMIC | 0 refills | Status: DC
  Filled 2022-12-04: qty 3.5, 30d supply, fill #0
  Filled 2022-12-04: qty 3.5, 7d supply, fill #0

## 2022-12-04 NOTE — Progress Notes (Signed)
   Acute Office Visit  Subjective:     Patient ID: Hannah Galloway, female    DOB: 1965-03-11, 57 y.o.   MRN: [id number]   Chief Complaint  Patient presents with   Ear Pain    Left ear, ongoing for a few weeks, is super itchy. Headache, face feels sore on left side, little inflammation   Patient is a 57 year old female paitent who presents today for an acute visit. She reports that her left ear has been more bothersome over the past few days. She reports it has been going on at least for a couple of weeks. Over the past week, more noticeable itching and feels pain on the left side of her face .   Review of Systems  Constitutional:  Negative for chills and fever.  HENT:  Positive for ear pain. Negative for congestion, ear discharge, sinus pain and sore throat.   Respiratory:  Negative for cough and shortness of breath.   Cardiovascular:  Negative for chest pain, palpitations and leg swelling.  Neurological:  Positive for headaches.       Objective:    BP 123/83   Pulse 60   Ht 5\' 6"  (1.676 m)   Wt 160 lb (72.6 kg)   SpO2 100%   BMI 25.82 kg/m   Physical Exam Constitutional:      Appearance: Normal appearance.  HENT:     Right Ear: Tympanic membrane, ear canal and external ear normal.     Left Ear: External ear normal. Swelling and tenderness present. Tympanic membrane is erythematous.  Cardiovascular:     Rate and Rhythm: Normal rate and regular rhythm.     Pulses: Normal pulses.     Heart sounds: Normal heart sounds.  Pulmonary:     Effort: Pulmonary effort is normal.     Breath sounds: Normal breath sounds.  Neurological:     Mental Status: She is alert.  Psychiatric:        Mood and Affect: Mood normal.        Behavior: Behavior normal.      Assessment & Plan:   1. Left ear pain The right pinna, tragus, and ear canal are non-tender and without swelling. The left ear canal is erythematous and tender. The right TM are translucent, pearly grey, and shiny with  no bulging or retraction. The left TM is erythematous and dull- with inability to visualize landmarks. Hearing is intact. Discussed benefits of antibiotic ear drops due to swelling of ear canal. Advised patient to return to office if symptoms persist or worsen after being on antibiotic drops for 48-72 hours.   Return in about 6 weeks (around 01/15/2023) for Physical with fasting labs.  Alyson Reedy, FNP

## 2022-12-05 ENCOUNTER — Other Ambulatory Visit (HOSPITAL_BASED_OUTPATIENT_CLINIC_OR_DEPARTMENT_OTHER): Payer: Self-pay

## 2022-12-05 ENCOUNTER — Other Ambulatory Visit (HOSPITAL_BASED_OUTPATIENT_CLINIC_OR_DEPARTMENT_OTHER): Payer: Self-pay | Admitting: Family Medicine

## 2022-12-05 MED ORDER — NEOMYCIN-POLYMYXIN-HC 3.5-10000-1 OT SOLN
3.0000 [drp] | Freq: Three times a day (TID) | OTIC | 0 refills | Status: DC
  Filled 2022-12-05: qty 10, 22d supply, fill #0

## 2022-12-06 DIAGNOSIS — H9202 Otalgia, left ear: Secondary | ICD-10-CM | POA: Insufficient documentation

## 2022-12-08 ENCOUNTER — Encounter (HOSPITAL_BASED_OUTPATIENT_CLINIC_OR_DEPARTMENT_OTHER): Payer: Self-pay | Admitting: Family Medicine

## 2023-01-07 ENCOUNTER — Other Ambulatory Visit (HOSPITAL_COMMUNITY): Payer: Self-pay

## 2023-01-15 ENCOUNTER — Encounter (HOSPITAL_BASED_OUTPATIENT_CLINIC_OR_DEPARTMENT_OTHER): Payer: Self-pay | Admitting: Family Medicine

## 2023-02-05 ENCOUNTER — Other Ambulatory Visit: Payer: Self-pay

## 2023-02-05 ENCOUNTER — Encounter: Payer: Self-pay | Admitting: Pharmacist

## 2023-02-06 ENCOUNTER — Other Ambulatory Visit (HOSPITAL_COMMUNITY): Payer: Self-pay

## 2023-02-10 ENCOUNTER — Other Ambulatory Visit: Payer: Self-pay

## 2023-03-13 DIAGNOSIS — H4010X1 Unspecified open-angle glaucoma, mild stage: Secondary | ICD-10-CM | POA: Diagnosis not present

## 2023-03-20 ENCOUNTER — Other Ambulatory Visit: Payer: Self-pay

## 2023-03-21 ENCOUNTER — Other Ambulatory Visit (HOSPITAL_COMMUNITY): Payer: Self-pay

## 2023-03-23 ENCOUNTER — Other Ambulatory Visit: Payer: Self-pay

## 2023-03-26 ENCOUNTER — Other Ambulatory Visit: Payer: Self-pay

## 2023-05-22 ENCOUNTER — Other Ambulatory Visit: Payer: Self-pay | Admitting: Obstetrics & Gynecology

## 2023-05-22 DIAGNOSIS — Z0000 Encounter for general adult medical examination without abnormal findings: Secondary | ICD-10-CM

## 2023-05-29 ENCOUNTER — Ambulatory Visit
Admission: RE | Admit: 2023-05-29 | Discharge: 2023-05-29 | Disposition: A | Discharge status: Home / Self Care | Source: Ambulatory Visit | Attending: Obstetrics & Gynecology | Admitting: Obstetrics & Gynecology

## 2023-05-29 DIAGNOSIS — Z0000 Encounter for general adult medical examination without abnormal findings: Secondary | ICD-10-CM

## 2023-05-29 DIAGNOSIS — Z1231 Encounter for screening mammogram for malignant neoplasm of breast: Secondary | ICD-10-CM | POA: Diagnosis not present

## 2023-06-16 ENCOUNTER — Ambulatory Visit (HOSPITAL_BASED_OUTPATIENT_CLINIC_OR_DEPARTMENT_OTHER): Payer: 59 | Admitting: Obstetrics & Gynecology

## 2023-06-16 ENCOUNTER — Encounter (HOSPITAL_BASED_OUTPATIENT_CLINIC_OR_DEPARTMENT_OTHER): Payer: Self-pay | Admitting: Obstetrics & Gynecology

## 2023-06-16 VITALS — BP 120/71 | HR 55 | Ht 66.0 in | Wt 158.8 lb

## 2023-06-16 DIAGNOSIS — Z780 Asymptomatic menopausal state: Secondary | ICD-10-CM

## 2023-06-16 DIAGNOSIS — Z01419 Encounter for gynecological examination (general) (routine) without abnormal findings: Secondary | ICD-10-CM | POA: Diagnosis not present

## 2023-06-16 DIAGNOSIS — I7121 Aneurysm of the ascending aorta, without rupture: Secondary | ICD-10-CM

## 2023-06-16 NOTE — Progress Notes (Unsigned)
   ANNUAL EXAM Patient name: Hannah Galloway MRN [id number]  Date of birth: Mar 31, 1965 Chief Complaint:   Annual Exam  History of Present Illness:   Hannah Galloway is a 58 y.o. G68P2002 Caucasian female being seen today for a routine annual exam.  Doing well.  Denies vaginal bleeding.    Patient's last menstrual period was 02/09/2018.   Last pap . 05/22/2021 Results were: NILM w/ HRHPV negative.  Last mammogram: 05/29/2023. Results were: normal. Family h/o breast cancer: no Last colonoscopy: 04/10/2022 . Results were: abnormal adenomatous polyps, follow up 3 years . Family h/o colorectal cancer: no     06/16/2023    4:23 PM 06/05/2022    3:27 PM 05/14/2021   11:47 AM 03/08/2021    8:02 AM  Depression screen PHQ 2/9  Decreased Interest 0 0 0 0  Down, Depressed, Hopeless 0 0 0 0  PHQ - 2 Score 0 0 0 0      Review of Systems:   Pertinent items are noted in HPI  Denies any vaginal bleeding, urinary changes or bowel changes.    Pertinent History Reviewed:  Reviewed past medical,surgical, social and family history.   Reviewed problem list, medications and allergies. Physical Assessment:   Vitals:   06/16/23 1619  Weight: 158 lb 12.8 oz (72 kg)  Height: 5\' 6"  (1.676 m)  Body mass index is 25.63 kg/m.        Physical Examination:   General appearance - well appearing, and in no distress  Mental status - alert, oriented to person, place, and time  Psych:  She has a normal mood and affect  Skin - warm and dry, normal color, no suspicious lesions noted  Chest - effort normal, all lung fields clear to auscultation bilaterally  Heart - normal rate and regular rhythm  Neck:  midline trachea, no thyromegaly or nodules  Breasts - breasts appear normal, no suspicious masses, no skin or nipple changes or  axillary nodes  Abdomen - soft, nontender, nondistended, no masses or organomegaly  Pelvic - VULVA: normal appearing vulva with no masses, tenderness or lesions    VAGINA: normal  appearing vagina with normal color and discharge, no lesions    CERVIX: normal appearing cervix without discharge or lesions, no CMT  Thin prep pap is not done  UTERUS: uterus is felt to be normal size, shape, consistency and nontender   ADNEXA: No adnexal masses or tenderness noted.  Rectal - normal rectal, good sphincter tone, no masses felt  Extremities:  No swelling or varicosities noted  Chaperone present for exam  Assessment & Plan:  1. Well woman exam with routine gynecological exam (Primary) - Pap smear 2023.  Not indicated today. - Mammogram 05/2023 - Colonoscopy 03/2022, follow up 3 years - lab work done 02/2021 with Dr. Lurene Salm - vaccines reviewed/updated  2. Postmenopausal  3. Aneurysm of ascending aorta without rupture (HCC) - followed by Dr. Veryl Gottron.  Next imaging due 02/2024.   Meds: No orders of the defined types were placed in this encounter.   Follow-up: No follow-ups on file.  Myrtie Atkinson, CMA 06/16/2023 4:23 PM

## 2023-07-23 ENCOUNTER — Telehealth: Admitting: Physician Assistant

## 2023-07-23 ENCOUNTER — Other Ambulatory Visit (HOSPITAL_COMMUNITY): Payer: Self-pay

## 2023-07-23 DIAGNOSIS — B9689 Other specified bacterial agents as the cause of diseases classified elsewhere: Secondary | ICD-10-CM | POA: Diagnosis not present

## 2023-07-23 DIAGNOSIS — J019 Acute sinusitis, unspecified: Secondary | ICD-10-CM | POA: Diagnosis not present

## 2023-07-23 MED ORDER — AMOXICILLIN-POT CLAVULANATE 875-125 MG PO TABS
1.0000 | ORAL_TABLET | Freq: Two times a day (BID) | ORAL | 0 refills | Status: DC
  Filled 2023-07-23: qty 14, 7d supply, fill #0

## 2023-07-23 NOTE — Progress Notes (Signed)

## 2023-07-23 NOTE — Progress Notes (Signed)
 I have spent 5 minutes in review of e-visit questionnaire, review and updating patient chart, medical decision making and response to patient.   Piedad Climes, PA-C

## 2023-10-20 DIAGNOSIS — S92511A Displaced fracture of proximal phalanx of right lesser toe(s), initial encounter for closed fracture: Secondary | ICD-10-CM | POA: Diagnosis not present

## 2024-01-15 DIAGNOSIS — H40013 Open angle with borderline findings, low risk, bilateral: Secondary | ICD-10-CM | POA: Diagnosis not present

## 2024-02-03 ENCOUNTER — Encounter (HOSPITAL_BASED_OUTPATIENT_CLINIC_OR_DEPARTMENT_OTHER): Payer: Self-pay | Admitting: Cardiology

## 2024-02-04 ENCOUNTER — Other Ambulatory Visit (HOSPITAL_COMMUNITY): Payer: Self-pay

## 2024-02-04 MED ORDER — TRAVOPROST (BAK FREE) 0.004 % OP SOLN
1.0000 [drp] | Freq: Every day | OPHTHALMIC | 6 refills | Status: AC
  Filled 2024-02-04: qty 2.5, 25d supply, fill #0
  Filled 2024-02-25: qty 2.5, 25d supply, fill #1
  Filled 2024-03-26: qty 10, 100d supply, fill #2

## 2024-02-26 ENCOUNTER — Encounter: Payer: Self-pay | Admitting: Pharmacist

## 2024-02-26 ENCOUNTER — Encounter (HOSPITAL_BASED_OUTPATIENT_CLINIC_OR_DEPARTMENT_OTHER): Payer: Self-pay | Admitting: Cardiology

## 2024-02-26 ENCOUNTER — Other Ambulatory Visit (HOSPITAL_COMMUNITY): Payer: Self-pay

## 2024-02-26 ENCOUNTER — Other Ambulatory Visit (HOSPITAL_COMMUNITY): Payer: Self-pay | Admitting: Rehabilitation

## 2024-02-26 ENCOUNTER — Other Ambulatory Visit: Payer: Self-pay

## 2024-02-26 ENCOUNTER — Ambulatory Visit (HOSPITAL_COMMUNITY)
Admission: RE | Admit: 2024-02-26 | Discharge: 2024-02-26 | Disposition: A | Discharge status: Home / Self Care | Source: Ambulatory Visit | Attending: Rehabilitation | Admitting: Rehabilitation

## 2024-02-26 ENCOUNTER — Ambulatory Visit (INDEPENDENT_AMBULATORY_CARE_PROVIDER_SITE_OTHER): Admitting: Cardiology

## 2024-02-26 VITALS — BP 106/76 | HR 60 | Ht 67.0 in | Wt 168.0 lb

## 2024-02-26 DIAGNOSIS — Z7189 Other specified counseling: Secondary | ICD-10-CM | POA: Diagnosis not present

## 2024-02-26 DIAGNOSIS — I77819 Aortic ectasia, unspecified site: Secondary | ICD-10-CM | POA: Diagnosis not present

## 2024-02-26 DIAGNOSIS — Z13220 Encounter for screening for lipoid disorders: Secondary | ICD-10-CM | POA: Diagnosis not present

## 2024-02-26 DIAGNOSIS — R52 Pain, unspecified: Secondary | ICD-10-CM | POA: Diagnosis present

## 2024-02-26 DIAGNOSIS — G548 Other nerve root and plexus disorders: Secondary | ICD-10-CM | POA: Diagnosis present

## 2024-02-26 DIAGNOSIS — R51 Headache with orthostatic component, not elsewhere classified: Secondary | ICD-10-CM

## 2024-02-26 DIAGNOSIS — Z712 Person consulting for explanation of examination or test findings: Secondary | ICD-10-CM | POA: Diagnosis not present

## 2024-02-26 DIAGNOSIS — Z8249 Family history of ischemic heart disease and other diseases of the circulatory system: Secondary | ICD-10-CM | POA: Diagnosis not present

## 2024-02-26 DIAGNOSIS — Z1321 Encounter for screening for nutritional disorder: Secondary | ICD-10-CM | POA: Diagnosis not present

## 2024-02-26 NOTE — Patient Instructions (Signed)
 Medication Instructions:  Your physician recommends that you continue on your current medications as directed. Please refer to the Current Medication list given to you today.  *If you need a refill on your cardiac medications before your next appointment, please call your pharmacy*  Lab Work: FASTING LIPID/LPa/VIT D SOON   If you have labs (blood work) drawn today and your tests are completely normal, you will receive your results only by: MyChart Message (if you have MyChart) OR A paper copy in the mail If you have any lab test that is abnormal or we need to change your treatment, we will call you to review the results.  Testing/Procedures: MRA OF CHEST IN JANUARY 2029  Follow-Up:  WITH Shelda Bruckner, MD, Rosaline Bane, NP, or Reche Finder, NP  AFTER MRA CHEST IN 02/2027

## 2024-02-26 NOTE — Progress Notes (Signed)
 " Cardiology Office Note:  .   Date:  02/26/2024  ID:  Hannah Galloway, DOB [birth year], MRN [id number] PCP: de [place], Raymond J, MD  [facility] Providers Cardiologist:  Shelda Bruckner, MD {  History of Present Illness: .   Hannah Galloway is a 59 y.o. female with a hx of ascending aortic aneurysm who is seen for follow-up.   Pertinent CV history: ascending aorta dilation noted 12/2017 on CT (3.7 cm). Has been imaged multiple times since and remained stable. Not well seen on echo. Has also had Ca score of 0.   Family history: Told that her father, paternal grandfather and paternal uncle all died of a massive heart attack. However she does not know this for certain (ie we discussed if it might have been an aortic dissection, but there is no way to know for sure)   Today: Having some neck pain, had x ray this AM, has appt with orthopedics in a few weeks. Reviewed her ascending aorta measurements, stability since 2019. Discussed options for evaluation, see below.   Has occasional lightheadedness but no syncope. Exercises 5 mornings/week.  ROS: Denies chest pain, shortness of breath at rest or with normal exertion. No PND, orthopnea, LE edema or unexpected weight gain. No syncope or palpitations. ROS otherwise negative except as noted.   Studies Reviewed: SABRA    EKG:       Physical Exam:   VS:  BP 106/76 (BP Location: Right Arm, Patient Position: Sitting, Cuff Size: Normal)   Pulse 60   Ht 5' 7 (1.702 m)   Wt 168 lb (76.2 kg)   LMP 02/09/2018   SpO2 99%   BMI 26.31 kg/m    Wt Readings from Last 3 Encounters:  02/26/24 168 lb (76.2 kg)  06/16/23 158 lb 12.8 oz (72 kg)  12/04/22 160 lb (72.6 kg)    GEN: Well nourished, well developed in no acute distress HEENT: Normal, moist mucous membranes NECK: No JVD CARDIAC: regular rhythm, normal S1 and S2, no rubs or gallops. No murmur. VASCULAR: Radial and DP pulses 2+ bilaterally. No carotid bruits RESPIRATORY:  Clear to  auscultation without rales, wheezing or rhonchi  ABDOMEN: Soft, non-tender, non-distended MUSCULOSKELETAL:  Ambulates independently SKIN: Warm and dry, no edema NEUROLOGIC:  Alert and oriented x 3. No focal neuro deficits noted. PSYCHIATRIC:  Normal affect    ASSESSMENT AND PLAN: .    Thoracic aortic dilation Family history of heart disease (though cannot exclude they were dissections) -measurement is actually on the borderline of normal -had been stable at 3.7 cm when measured in 2019, 2020, 2021 -most recent 02/2022 MRA with stable measurement of 3.7 cm -no known family history of aortic disease (though has been told family members died of MI, cannot exclude potential other etiology) -no known history of connective tissue diseases -has tricuspid aortic valve -fluoroquinolones already on adverse medication list -blood pressure at goal -lipids borderline in 2020, but no coronary calcium and low ASCVD risk -Discussed monitoring every 2 years if stable vs spacing out vs stopping monitoring given it is within range of variability. Suspect given stability, this is her baseline measurement and she is just borderline for enlargement. Not well seen on echo, needs MRA or CT. With her family history, she would like to continue to screen. After shared decision making, will plan to repeat in 5 years from prior study, 02/2027. Cannot order now, will need to order in the future. -discussed checking lpa, she is amenable. Will  check lipids as well. Asking to have vitamin d  checked too  CV risk counseling and prevention -recommend heart healthy/Mediterranean diet, with whole grains, fruits, vegetable, fish, lean meats, nuts, and olive oil. Limit salt. -recommend moderate walking, 3-5 times/week for 30-50 minutes each session. Aim for at least 150 minutes/week. Goal should be pace of 3 miles/hours, or walking 1.5 miles in 30 minutes -recommend avoidance of tobacco products. Avoid excess alcohol. -ASCVD risk  score: The 10-year ASCVD risk score (Arnett DK, et al., 2019) is: 1.3%   Values used to calculate the score:     Age: 54 years     Clinically relevant sex: Female     Is Non-Hispanic African American: No     Diabetic: No     Tobacco smoker: No     Systolic Blood Pressure: 106 mmHg     Is BP treated: No     HDL Cholesterol: 67 mg/dL     Total Cholesterol: 204 mg/dL    Dispo: Follow up after MRA 02/2027  Signed, Shelda Bruckner, MD   Shelda Bruckner, MD, PhD, [facility]  [facility]  [place]  [facility] [address] [place], [place] [address] [phone]   "

## 2024-03-02 ENCOUNTER — Encounter (HOSPITAL_BASED_OUTPATIENT_CLINIC_OR_DEPARTMENT_OTHER): Payer: Self-pay | Admitting: Orthopaedic Surgery

## 2024-03-16 ENCOUNTER — Other Ambulatory Visit (HOSPITAL_COMMUNITY): Payer: Self-pay

## 2024-03-16 ENCOUNTER — Other Ambulatory Visit (HOSPITAL_BASED_OUTPATIENT_CLINIC_OR_DEPARTMENT_OTHER): Payer: Self-pay

## 2024-03-16 ENCOUNTER — Other Ambulatory Visit: Payer: Self-pay

## 2024-03-16 ENCOUNTER — Ambulatory Visit (INDEPENDENT_AMBULATORY_CARE_PROVIDER_SITE_OTHER): Admitting: Orthopaedic Surgery

## 2024-03-16 DIAGNOSIS — M542 Cervicalgia: Secondary | ICD-10-CM

## 2024-03-16 MED ORDER — METHOCARBAMOL 500 MG PO TABS
500.0000 mg | ORAL_TABLET | Freq: Three times a day (TID) | ORAL | 2 refills | Status: AC
  Filled 2024-03-16 – 2024-03-17 (×3): qty 90, 30d supply, fill #0

## 2024-03-16 NOTE — Progress Notes (Signed)
 "   Chief Complaint: Left neck and shoulder pain     History of Present Illness:    Hannah Galloway is a 59 y.o. female presents today with several months of left-sided paracervical neck pain which has been radiating into the peritrapezial and scapular region.  She does not deny any pain radiating to the left side or down the arm.  She does have a history of previous lumbar spinal surgery.  She has been improving with regard to the left shoulder following her previous massage.  She has had some chiropractic work in the past.  She has not had any previous physical therapy for the neck.    PMH/PSH/Family History/Social History/Meds/Allergies:    Past Medical History:  Diagnosis Date   Allergy age 54   Ascending aortic aneurysm    Bilateral carpal tunnel syndrome 08/09/2018   Eye pressure    increased but not glaucoma   Glaucoma pre glaucoma   Past Surgical History:  Procedure Laterality Date   BACK SURGERY  08/2008   CARPAL TUNNEL RELEASE Right 2020   SPINE SURGERY  July 2010   TONSILLECTOMY AND ADENOIDECTOMY  1974   Social History   Socioeconomic History   Marital status: Married    Spouse name: Not on file   Number of children: 2   Years of education: Not on file   Highest education level: Not on file  Occupational History    Comment: Lab   Tobacco Use   Smoking status: Never    Passive exposure: Never   Smokeless tobacco: Never  Vaping Use   Vaping status: Never Used  Substance and Sexual Activity   Alcohol use: No   Drug use: Never   Sexual activity: Yes    Birth control/protection: Post-menopausal  Other Topics Concern   Not on file  Social History Narrative   Not on file   Social Drivers of Health   Tobacco Use: Low Risk (02/26/2024)   Patient History    Smoking Tobacco Use: Never    Smokeless Tobacco Use: Never    Passive Exposure: Never  Financial Resource Strain: Not on file  Food Insecurity: Not on file  Transportation Needs: Not on file  Physical  Activity: Not on file  Stress: Not on file  Social Connections: Not on file  Depression (PHQ2-9): Low Risk (06/16/2023)   Depression (PHQ2-9)    PHQ-2 Score: 0  Alcohol Screen: Not on file  Housing: Not on file  Utilities: Not on file  Health Literacy: Not on file   Family History  Problem Relation Age of Onset   Stroke Father    Heart disease Father    Diabetes Father        type ll   Heart attack Father    Hypertension Father    Hypertension Mother    Arthritis Mother    Asthma Mother    Lymphoma Brother 107       t-call   Cancer Brother 19       T cell lymphoma   Allergies[1] Current Outpatient Medications  Medication Sig Dispense Refill   methocarbamol  (ROBAXIN ) 500 MG tablet Take 1 tablet (500 mg total) by mouth 3 (three) times daily. 90 tablet 2   Multiple Vitamins-Minerals (MULTIVITAMIN ADULTS 50+ PO) Take 1 tablet by mouth daily.     Travoprost , BAK Free, (TRAVATAN ) 0.004 % SOLN ophthalmic solution Instill 1 drop into affected eyes once daily in the evening 5 mL 6   No current facility-administered medications  for this visit.   No results found.  Review of Systems:   A ROS was performed including pertinent positives and negatives as documented in the HPI.  Physical Exam :   Constitutional: NAD and appears stated age Neurological: Alert and oriented Psych: Appropriate affect and cooperative Last menstrual period 02/09/2018.   Comprehensive Musculoskeletal Exam:    Left neck with tenderness about the paraspinal musculature which is worse with rotating to the right.  Strength and sensation entire left upper extremity is within normal limits   Imaging:   Xray (4 views cervical spine): There are several levels of mild degenerative disc disease   I personally reviewed and interpreted the radiographs.   Assessment and Plan:   59 y.o. 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  I did  discuss ergonomic changing agent including an over-the-counter brace as well as a prescription for Robaxin  to assist with her muscular spasm.  We will plan to order physical therapy for a home strengthening program and I will plan to see her back as needed   I personally saw and evaluated the patient, and participated in the management and treatment plan.  Elspeth Parker, MD Attending Physician, Orthopedic Surgery  This document was dictated using Dragon voice recognition software. A reasonable attempt at proof reading has been made to minimize errors.    [1]  Allergies Allergen Reactions   Quinolones     Patient was warned about not using Cipro and similar antibiotics. Recent studies have raised concern that fluoroquinolone antibiotics could be associated with an increased risk of aortic aneurysm Fluoroquinolones have non-antimicrobial properties that might jeopardise the integrity of the extracellular matrix of the vascular wall In a  propensity score matched cohort study in [place], there was a 66% increased rate of aortic aneurysm or dissection associated with oral fluoroquinolone use, compared wit   "

## 2024-03-17 ENCOUNTER — Other Ambulatory Visit (HOSPITAL_COMMUNITY): Payer: Self-pay

## 2024-03-17 ENCOUNTER — Other Ambulatory Visit (HOSPITAL_BASED_OUTPATIENT_CLINIC_OR_DEPARTMENT_OTHER): Payer: Self-pay

## 2024-03-20 ENCOUNTER — Other Ambulatory Visit (HOSPITAL_COMMUNITY): Payer: Self-pay

## 2024-03-20 MED ORDER — TRAVOPROST (BAK FREE) 0.004 % OP SOLN
1.0000 [drp] | Freq: Every evening | OPHTHALMIC | 6 refills | Status: AC
  Filled 2024-03-20: qty 7.5, 100d supply, fill #0

## 2024-03-21 ENCOUNTER — Other Ambulatory Visit: Payer: Self-pay

## 2024-03-21 ENCOUNTER — Other Ambulatory Visit (HOSPITAL_COMMUNITY): Payer: Self-pay

## 2024-03-22 ENCOUNTER — Other Ambulatory Visit: Payer: Self-pay

## 2024-03-23 ENCOUNTER — Other Ambulatory Visit: Payer: Self-pay

## 2024-03-27 ENCOUNTER — Other Ambulatory Visit (HOSPITAL_COMMUNITY): Payer: Self-pay

## 2024-03-28 ENCOUNTER — Encounter (HOSPITAL_COMMUNITY): Payer: Self-pay | Admitting: Pharmacist

## 2024-03-28 ENCOUNTER — Other Ambulatory Visit: Payer: Self-pay

## 2024-03-28 ENCOUNTER — Other Ambulatory Visit (HOSPITAL_COMMUNITY): Payer: Self-pay

## 2024-03-29 ENCOUNTER — Other Ambulatory Visit: Payer: Self-pay

## 2024-04-15 ENCOUNTER — Ambulatory Visit (HOSPITAL_BASED_OUTPATIENT_CLINIC_OR_DEPARTMENT_OTHER): Admitting: Cardiology

## 2024-06-20 ENCOUNTER — Ambulatory Visit (HOSPITAL_BASED_OUTPATIENT_CLINIC_OR_DEPARTMENT_OTHER): Admitting: Obstetrics & Gynecology

## 2024-09-01 ENCOUNTER — Ambulatory Visit (HOSPITAL_BASED_OUTPATIENT_CLINIC_OR_DEPARTMENT_OTHER): Admitting: Obstetrics & Gynecology
